# Patient Record
Sex: Female | Born: 1960 | Race: White | Hispanic: No
Health system: Midwestern US, Community
[De-identification: ages and names within clinical notes are randomized; demographics above are authoritative.]

---

## 2018-11-27 LAB — CBC WITH AUTO DIFFERENTIAL
Absolute Baso #: 0.1 10*3/uL (ref 0.0–0.2)
Absolute Eos #: 0.1 10*3/uL (ref 0.0–0.5)
Absolute Lymph #: 1.3 10*3/uL (ref 1.0–3.2)
Absolute Mono #: 0.6 10*3/uL (ref 0.3–1.0)
Basophils %: 1 % (ref 0.0–2.0)
Eosinophils %: 1 % (ref 0.0–7.0)
Hematocrit: 37.1 % (ref 34.0–47.0)
Hemoglobin: 13.6 g/dL (ref 11.5–15.7)
Immature Grans (Abs): 0.05 10*3/uL (ref 0.00–0.06)
Immature Granulocytes: 0.7 % — ABNORMAL HIGH (ref 0.1–0.6)
Lymphocytes: 18.4 % (ref 15.0–45.0)
MCH: 32.3 pg (ref 27.0–34.5)
MCHC: 36.7 g/dL — ABNORMAL HIGH (ref 32.0–36.0)
MCV: 88.1 fL (ref 81.0–99.0)
MPV: 10.2 fL (ref 7.2–13.2)
Monocytes: 9 % (ref 4.0–12.0)
Neutrophils %: 69.9 % (ref 42.0–74.0)
Neutrophils Absolute: 4.8 10*3/uL (ref 1.6–7.3)
Platelets: 212 10*3/uL (ref 140–440)
RBC: 4.21 x10e6/mcL (ref 3.60–5.20)
RDW: 11.8 % (ref 11.0–16.0)
WBC: 6.8 10*3/uL (ref 3.8–10.6)

## 2018-11-27 LAB — POC URINALYSIS, CHEMISTRY
Bilirubin, Urine, POC: NEGATIVE
Blood, UA POC: NEGATIVE
Glucose, UA POC: NEGATIVE mg/dL
Ketones, Urine, POC: NEGATIVE mg/dL
Nitrate, UA POC: NEGATIVE
Protein, Urine, POC: NEGATIVE
Specific Gravity, Urine, POC: <=1.005 (ref 1.003–1.035)
UROBILIN U POC: 0.2 EU/dL
pH, Urine, POC: 6 (ref 4.5–8.0)

## 2018-11-27 LAB — COMPREHENSIVE METABOLIC PANEL
ALT: 48 U/L — ABNORMAL HIGH (ref 0–33)
AST: 53 U/L — ABNORMAL HIGH (ref 0–32)
Albumin/Globulin Ratio: 1 mmol/L (ref 1.00–2.00)
Albumin: 3.9 g/dL (ref 3.5–5.2)
Alk Phosphatase: 72 U/L (ref 35–117)
Anion Gap: 12 mmol/L (ref 2–17)
BUN: 6 mg/dL (ref 6–20)
CO2: 32 mmol/L — ABNORMAL HIGH (ref 22–29)
Calcium: 9.7 mg/dL (ref 8.6–10.0)
Chloride: 73 mmol/L — CL (ref 98–107)
Creatinine: 0.9 mg/dL (ref 0.5–0.9)
GFR African American: 83 mL/min/{1.73_m2} — ABNORMAL LOW (ref >=90)
GFR Non-African American: 72 mL/min/{1.73_m2} — ABNORMAL LOW (ref >=90)
Globulin: 3 g/dL (ref 1.9–4.4)
Glucose: 144 mg/dL — ABNORMAL HIGH (ref 70–99)
OSMOLALITY CALCULATED: 237 mOsm/kg — ABNORMAL LOW (ref 270–287)
Potassium: 2.4 mmol/L — CL (ref 3.5–5.3)
Sodium: 117 mmol/L — CL (ref 135–145)
Total Bilirubin: 0.73 mg/dL (ref 0.00–1.20)
Total Protein: 6.6 g/dL (ref 6.4–8.3)

## 2018-11-27 LAB — SODIUM, URINE, RANDOM: SODIUM, RANDOM URINE: 20

## 2018-11-27 LAB — COVID-19, SURVEILLANCE (ASYMPTOMATIC/NO EXPOSURE, OR TEST OF CURE)
Lot/Kit Number: 706075
Lot/Kit expire date:: 6152021
SARS Cov2 Ag FIA: NEGATIVE

## 2018-11-27 LAB — AMMONIA: Ammonia: 42 mcmol/L (ref 11–51)

## 2018-11-27 LAB — SODIUM: Sodium: 116 mmol/L — CL (ref 135–145)

## 2018-11-27 NOTE — ED Notes (Signed)
ED Patient Education Note     Patient Education Materials Follows:

## 2018-11-27 NOTE — Discharge Summary (Signed)
ED Clinical Summary                     [facility]  [address] [place] [address]  [phone]          PERSON INFORMATION  Name: Allison Patton, SHROPSHIRE Age:  58 Years DOB: 1960-09-20   Sex: Female Language: English PCP: Sherril Cong   Marital Status: Single Phone: [phone] Med Service: MED-Medicine   MRN: [id number] Acct# [id number] Arrival: 11/27/2018 16:30:00   Visit Reason: Medical problem reevaluation; ABN LABS Acuity: 3 LOS: 000 03:06   Address:    [place] [address]   Diagnosis:    Dizziness; Hypokalemia; Hyponatremia  Medications:          Medications that have not changed  Other Medications  levothyroxine (levothyroxine 88 mcg (0.088 mg) oral capsule) 1 Capsules Oral (given by mouth) every day.  Last Dose:____________________  lisinopril (lisinopril 10 mg oral tablet) 1 Tabs Oral (given by mouth) every day.  Last Dose:____________________  melatonin Once a Day (at bedtime).  Last Dose:____________________      Medications Administered During Visit:                Medication Dose Route   Sodium Chloride 0.9% 1000 mL IV Piggyback   potassium chloride 40 mEq Oral               Allergies      No Known Medication Allergies      Major Tests and Procedures:  The following procedures and tests were performed during your ED visit.  COMMON PROCEDURES%>  COMMON PROCEDURES COMMENTS%>                PROVIDER INFORMATION               Provider Role Assigned [place], [place] ED MidLevel 11/27/2018 14:44:41    Waynard Reeds, RN, Bo Mcclintock ED Nurse 11/27/2018 15:09:36        Attending Physician:  Retta Diones      Admit Doc  BOND-MD,  BROOKE ERIKA     Consulting Doc  BOND-MD,  Mirian Capuchin     VITALS INFORMATION  Vital Sign Triage Latest   Temp Oral ORAL_1%> ORAL%>   Temp Temporal TEMPORAL_1%> TEMPORAL%>   Temp Intravascular INTRAVASCULAR_1%> INTRAVASCULAR%>   Temp Axillary AXILLARY_1%> AXILLARY%>   Temp Rectal RECTAL_1%>  RECTAL%>   02 Sat 94 % 99 %   Respiratory Rate RATE_1%> RATE%>   Peripheral Pulse Rate PULSE RATE_1%> PULSE RATE%>   Apical Heart Rate HEART RATE_1%> HEART RATE%>   Blood Pressure BLOOD PRESSURE_1%>/ BLOOD PRESSURE_1%>74 mmHg BLOOD PRESSURE%> / BLOOD PRESSURE%>67 mmHg                 Immunizations      No Immunizations Documented This Visit          DISCHARGE INFORMATION   Discharge Disposition: S Outpt-Admitted As Inpatient   Discharge Location:  [facility]   Discharge Date and Time:     ED Checkout Date and Time:  11/27/2018 17:49:30     DEPART REASON INCOMPLETE INFORMATION             Problems      No Problems Documented              Smoking Status      Former smoker, quit more than  30 days ago         PATIENT EDUCATION INFORMATION  Instructions:          Follow up:            ED PROVIDER DOCUMENTATION

## 2018-11-27 NOTE — ED Notes (Signed)
ED Triage Note       ED Triage Adult Entered On:  11/27/2018 14:56 EDT    Performed On:  11/27/2018 14:48 EDT by Waynard Reeds, RN, Gustine S               Triage   Chief Complaint :   Seen at [facility] yesterday for dizziness, they sent her here today for recheck and treatment for low potassium and sodium   Numeric Rating Pain Scale :   0 = No pain   [place] Mode of Arrival :   Walking   Infectious Disease Documentation :   Document assessment   Temperature Oral :   36.8 degC(Converted to: 98.2 degF)    Heart Rate Monitored :   80 bpm   Respiratory Rate :   16 br/min   Systolic Blood Pressure :   283 mmHg   Diastolic Blood Pressure :   74 mmHg   SpO2 :   94 %   Oxygen Therapy :   Room air   Patient presentation :   None of the above   Chief Complaint or Presentation suggest infection :   No   Weight Dosing :   84 kg(Converted to: 185 lb 3 oz)    Height :   173 cm(Converted to: 5 ft 8 in)    Body Mass Index Dosing :   28 kg/m2   Reva Bores - 11/27/2018 14:48 EDT   DCP GENERIC CODE   Tracking Acuity :   3   Tracking Group :   ED [employer]   [place], RNBo Mcclintock - 11/27/2018 14:48 EDT   ED General Section :   Document assessment   Pregnancy Status :   Patient denies   ED Allergies Section :   Document assessment   ED Reason for Visit Section :   Document assessment   ED Home Meds Section :   Document assessment   Waynard Reeds, RN, Bo Mcclintock - 11/27/2018 14:48 EDT   ID Risk Screen Symptoms   Recent Travel History :   No recent travel   Close Contact with COVID-19 ID :   No   Last 14 days COVID-19 ID :   Yes - Not Detected (negative)   TB Symptom Screen :   No symptoms   C. diff Symptom/History ID :   Neither of the above   GARBETT, RN, Jocelyn Lamer S - 11/27/2018 14:48 EDT   Allergies   (As Of: 11/27/2018 14:56:18 EDT)   Allergies (Active)   No Known Medication Allergies  Estimated Onset Date:   Unspecified ; Created By:   Marvel Plan RN, Anderson Malta; Reaction Status:   Active ; Category:   Drug ; Substance:   No Known  Medication Allergies ; Type:   Allergy ; Updated By:   Marvel Plan RN, JENNIFER; Reviewed Date:   11/27/2018 14:52 EDT        Psycho-Social   Last 3 mo, thoughts killing self/others :   Patient denies   Waynard Reeds, RNBo Mcclintock - 11/27/2018 14:48 EDT   ED Home Med List   Medication List   (As Of: 11/27/2018 14:56:18 EDT)   Home Meds    atenolol-chlorthalidone  :   atenolol-chlorthalidone ; Status:   Discontinued ; Ordered As Mnemonic:   atenolol-chlorthalidone 50 mg-25 mg oral tablet ; Simple Display Line:   1 tabs, Oral, Daily, 30 tabs, 0 Refill(s) ; Catalog Code:   atenolol-chlorthalidone ; Order Dt/Tm:  06/10/2017 15:06:04 EST          ergocalciferol  :   ergocalciferol ; Status:   Discontinued ; Ordered As Mnemonic:   Vitamin D ; Simple Display Line:   Oral, 0 Refill(s) ; Catalog Code:   ergocalciferol ; Order Dt/Tm:   06/10/2017 15:06:38 EST          lisinopril  :   lisinopril ; Status:   Documented ; Ordered As Mnemonic:   lisinopril 10 mg oral tablet ; Simple Display Line:   10 mg, 1 tabs, Oral, Daily, 0 Refill(s) ; Catalog Code:   lisinopril ; Order Dt/Tm:   06/10/2017 15:06:15 EST          melatonin  :   melatonin ; Status:   Documented ; Ordered As Mnemonic:   melatonin ; Simple Display Line:   Once a Day (at bedtime), 0 Refill(s) ; Catalog Code:   melatonin ; Order Dt/Tm:   06/10/2017 15:06:31 EST          levothyroxine  :   levothyroxine ; Status:   Documented ; Ordered As Mnemonic:   levothyroxine 88 mcg (0.088 mg) oral capsule ; Simple Display Line:   88 mcg, 1 caps, Oral, Daily, 30 caps, 0 Refill(s) ; Catalog Code:   levothyroxine ; Order Dt/Tm:   06/10/2017 15:05:53 EST            ED Reason for Visit   (As Of: 11/27/2018 14:56:18 EDT)   Problems(Active)    HTN (hypertension) (SNOMED CT  :[id number] )  Name of Problem:   HTN (hypertension) ; Recorder:   RICHARDSON, RN, Cardinal HealthJENNIFER; Confirmation:   Confirmed ; Classification:   Patient Stated ; Code:   [id number] ; Contributor System:   DietitianowerChart ; Last Updated:    06/10/2017 15:04 EST ; Life Cycle Date:   06/10/2017 ; Life Cycle Status:   Active ; Vocabulary:   SNOMED CT        Thyroid disease (SNOMED CT  :2956213024324018 )  Name of Problem:   Thyroid disease ; Recorder:   RICHARDSON, RN, JENNIFER; Confirmation:   Confirmed ; Classification:   Patient Stated ; Code:   [id number] ; Contributor System:   DietitianowerChart ; Last Updated:   06/10/2017 15:04 EST ; Life Cycle Date:   06/10/2017 ; Life Cycle Status:   Active ; Vocabulary:   SNOMED CT          Diagnoses(Active)    Medical problem reevaluation  Date:   11/27/2018 ; Diagnosis Type:   Reason For Visit ; Confirmation:   Complaint of ; Clinical Dx:   Medical problem reevaluation ; Classification:   Medical ; Clinical Service:   Emergency medicine ; Code:   PNED ; Probability:   0 ; Diagnosis Code:   [id number]

## 2018-11-27 NOTE — Nursing Note (Signed)
Adult Patient History Form-Text       Adult Patient History Entered On:  11/27/2018 18:57 EDT    Performed On:  11/27/2018 18:30 EDT by Mosie Epstein, RN, MEGAN M               General Info   Patient Identified :   Identification band   Patient Identified :   Allison Patton   Information Given By :   Self   Accompanied By :   None   In Charge of News (ICON) Name :   [place], local friend, [phone]  Yajaira Fayard, brother, [phone]  Jassmin Macon, sister in law, [phone]   Pregnancy Status :   Patient denies   Has the patient received chemotherapy or immunotherapy (cytotoxic)  in the last 48-72 hours? :   No   In Clinical Trial With Signed Consent for Related Condition :   No signed consent for clinical trial   Is the patient currently (2-3 days) receiving radiation treatment? :   No   HIGBIE, RN, MEGAN M - 11/27/2018 18:30 EDT   Allergies   (As Of: 11/27/2018 19:43:47 EDT)   Allergies (Active)   No Known Medication Allergies  Estimated Onset Date:   Unspecified ; Created By:   Senaida Ores RN, Victorino Dike; Reaction Status:   Active ; Category:   Drug ; Substance:   No Known Medication Allergies ; Type:   Allergy ; Updated By:   Senaida Ores RN, JENNIFER; Reviewed Date:   11/27/2018 19:37 EDT        Medication History   Medication List   (As Of: 11/27/2018 19:43:47 EDT)   Normal Order    folic acid 1 mg Tab  :   folic acid 1 mg Tab ; Status:   Ordered ; Ordered As Mnemonic:   folic acid ; Simple Display Line:   1 mg, 1 tabs, Oral, Daily ; Ordering Provider:   Ala Bent; Catalog Code:   folic acid ; Order Dt/Tm:   11/27/2018 18:08:35 EDT          lisinopril 10 mg Tab  :   lisinopril 10 mg Tab ; Status:   Ordered ; Ordered As Mnemonic:   lisinopril ; Simple Display Line:   10 mg, 1 tabs, Oral, Daily ; Ordering Provider:   Ala Bent; Catalog Code:   lisinopril ; Order Dt/Tm:   11/27/2018 17:06:08 EDT          Therapeutic Multiple Vitamins with Minerals Tab  :   Therapeutic Multiple Vitamins with Minerals Tab  ; Status:   Ordered ; Ordered As Mnemonic:   Therapeutic Multiple Vitamins with Minerals oral tablet ; Simple Display Line:   1 tabs, Oral, Daily ; Ordering Provider:   Ala Bent; Catalog Code:   multivitamin with minerals ; Order Dt/Tm:   11/27/2018 18:08:35 EDT          thiamine 100 mg Tab  :   thiamine 100 mg Tab ; Status:   Ordered ; Ordered As Mnemonic:   thiamine ; Simple Display Line:   100 mg, 1 tabs, Oral, Daily ; Ordering Provider:   Ala Bent; Catalog Code:   thiamine ; Order Dt/Tm:   11/27/2018 18:08:35 EDT          levothyroxine 88 mcg (0.088 mg) Tab  :   levothyroxine 88 mcg (0.088 mg) Tab ; Status:   Ordered ; Ordered As Mnemonic:   levothyroxine ; Simple Display Line:  88 mcg, 1 tabs, Oral, Daily ; Ordering Provider:   Ala Bent; Catalog Code:   levothyroxine ; Order Dt/Tm:   11/27/2018 17:06:06 EDT          melatonin 3 mg Tab  :   melatonin 3 mg Tab ; Status:   Ordered ; Ordered As Mnemonic:   melatonin ; Simple Display Line:   3 mg, 1 tabs, Oral, Once a Day (at bedtime) ; Ordering Provider:   Ala Bent; Catalog Code:   melatonin ; Order Dt/Tm:   11/27/2018 17:06:09 EDT          potassium chloride 20 mEq ER Tab  :   potassium chloride 20 mEq ER Tab ; Status:   Completed ; Ordered As Mnemonic:   K-Dur 20 ; Simple Display Line:   40 mEq, 2 tabs, Oral, Once ; Ordering Provider:   Ala Bent; Catalog Code:   potassium chloride ; Order Dt/Tm:   11/27/2018 19:01:47 EDT          potassium chloride  :   potassium chloride ; Status:   Ordered ; Ordered As Mnemonic:   potassium chloride 10 mEq/100 mL intravenous solution ; Simple Display Line:   10 mEq, 100 mL, 100 mL/hr, IV Piggyback, q1hr ; Ordering Provider:   Ala Bent; Catalog Code:   potassium chloride ; Order Dt/Tm:   11/27/2018 19:02:53 EDT ; Comment:   Note total dose of Potassium is . All orders for IV Potassium require a dose limit for patient safety.           acetaminophen 325 mg Tab  :   acetaminophen 325 mg Tab ; Status:   Ordered ; Ordered As Mnemonic:   acetaminophen ; Simple Display Line:   650 mg, 2 tabs, Oral, q4hr, PRN: mild pain (1-3) ; Ordering Provider:   Ala Bent; Catalog Code:   acetaminophen ; Order Dt/Tm:   11/27/2018 18:09:18 EDT          aluminum hydroxide/magnesium hydroxide/simethicone 200 mg-200 mg-20 mg/5 mL  :   aluminum hydroxide/magnesium hydroxide/simethicone 200 mg-200 mg-20 mg/5 mL ; Status:   Ordered ; Ordered As Mnemonic:   aluminum hydroxide/magnesium hydroxide/simethicone 200 mg-200 mg-20 mg/5 mL oral suspension ; Simple Display Line:   30 mL, Oral, q3hr, PRN: indigestion ; Ordering Provider:   Ala Bent; Catalog Code:   Al hydroxide/Mg hydroxide/simethicone ; Order Dt/Tm:   11/27/2018 18:09:18 EDT          ondansetron 2 mg/mL Inj Soln 2 mL  :   ondansetron 2 mg/mL Inj Soln 2 mL ; Status:   Ordered ; Ordered As Mnemonic:   ondansetron ; Simple Display Line:   4 mg, 2 mL, IV Push, q6hr, PRN: nausea/vomiting ; Ordering Provider:   Ala Bent; Catalog Code:   ondansetron ; Order Dt/Tm:   11/27/2018 18:09:18 EDT          LORazepam 0.5 mg Tab  :   LORazepam 0.5 mg Tab ; Status:   Ordered ; Ordered As Mnemonic:   LORazepam ; Simple Display Line:   0.5 mg, 1 tabs, Oral, q4hr, PRN: other (see comment) ; Ordering Provider:   Ala Bent; Catalog Code:   LORazepam ; Order Dt/Tm:   11/27/2018 18:08:35 EDT ; Comment:   PRN for CIWA Score greater than or equal to 8. Repeat CIWA in 2hrs, notify MD if greater than or equal to 8.  Consider lower dose for medically compromised or elderly greater than or equal to 65          potassium chloride 20 mEq ER Tab  :   potassium chloride 20 mEq ER Tab ; Status:   Completed ; Ordered As Mnemonic:   potassium chloride 20 mEq oral tablet, extended release ; Simple Display Line:   40 mEq, 2 tabs, Oral, Once ; Ordering Provider:   Karlene Einstein LANE-PA-C; Catalog Code:    potassium chloride ; Order Dt/Tm:   11/27/2018 16:14:58 EDT          Sodium Chloride 0.9% intravenous solution Bolus  :   Sodium Chloride 0.9% intravenous solution Bolus ; Status:   Completed ; Ordered As Mnemonic:   Sodium Chloride 0.9% bolus ; Simple Display Line:   1,000 mL, 2000 mL/hr, IV Piggyback, Once ; Ordering Provider:   Karlene Einstein LANE-PA-C; Catalog Code:   Sodium Chloride 0.9% ; Order Dt/Tm:   11/27/2018 15:00:22 EDT            Home Meds    ALPRAZolam  :   ALPRAZolam ; Status:   Documented ; Ordered As Mnemonic:   ALPRAZolam 1 mg oral tablet ; Simple Display Line:   1 mg, 1 tabs, Oral, Daily, PRN: for anxiety, 0 Refill(s) ; Catalog Code:   ALPRAZolam ; Order Dt/Tm:   11/27/2018 18:07:01 EDT          atenolol-chlorthalidone  :   atenolol-chlorthalidone ; Status:   Discontinued ; Ordered As Mnemonic:   atenolol-chlorthalidone 50 mg-25 mg oral tablet ; Simple Display Line:   1 tabs, Oral, Daily, 30 tabs, 0 Refill(s) ; Catalog Code:   atenolol-chlorthalidone ; Order Dt/Tm:   06/10/2017 15:06:04 EST          ergocalciferol  :   ergocalciferol ; Status:   Discontinued ; Ordered As Mnemonic:   Vitamin D ; Simple Display Line:   Oral, 0 Refill(s) ; Catalog Code:   ergocalciferol ; Order Dt/Tm:   06/10/2017 15:06:38 EST          lisinopril  :   lisinopril ; Status:   Documented ; Ordered As Mnemonic:   lisinopril 10 mg oral tablet ; Simple Display Line:   10 mg, 1 tabs, Oral, Daily, 0 Refill(s) ; Catalog Code:   lisinopril ; Order Dt/Tm:   06/10/2017 15:06:15 EST          melatonin  :   melatonin ; Status:   Documented ; Ordered As Mnemonic:   melatonin ; Simple Display Line:   Once a Day (at bedtime), 0 Refill(s) ; Catalog Code:   melatonin ; Order Dt/Tm:   06/10/2017 15:06:31 EST          levothyroxine  :   levothyroxine ; Status:   Documented ; Ordered As Mnemonic:   levothyroxine 88 mcg (0.088 mg) oral capsule ; Simple Display Line:   88 mcg, 1 caps, Oral, Daily, 30 caps, 0 Refill(s) ; Catalog Code:    levothyroxine ; Order Dt/Tm:   06/10/2017 15:05:53 EST            Problem History   (As Of: 11/27/2018 19:43:47 EDT)   Problems(Active)    HTN (hypertension) (SNOMED CT  :3220254270 )  Name of Problem:   HTN (hypertension) ; Recorder:   RICHARDSON, RN, [employer]; Confirmation:   Confirmed ; Classification:   Patient Stated ; Code:   [id number] ; Contributor System:   Dietitian ; Last Updated:  06/10/2017 15:04 EST ; Life Cycle Date:   06/10/2017 ; Life Cycle Status:   Active ; Vocabulary:   SNOMED CT        Thyroid disease (SNOMED CT  :16109604 )  Name of Problem:   Thyroid disease ; Recorder:   RICHARDSON, RN, JENNIFER; Confirmation:   Confirmed ; Classification:   Patient Stated ; Code:   [id number] ; Contributor System:   Dietitian ; Last Updated:   06/10/2017 15:04 EST ; Life Cycle Date:   06/10/2017 ; Life Cycle Status:   Active ; Vocabulary:   SNOMED CT          Diagnoses(Active)    Dizziness  Date:   11/27/2018 ; Diagnosis Type:   Discharge ; Confirmation:   Confirmed ; Clinical Dx:   Dizziness ; Classification:   Medical ; Clinical Service:   Non-Specified ; Code:   ICD-10-CM ; Probability:   0 ; Diagnosis Code:   R42      Hypokalemia  Date:   11/27/2018 ; Diagnosis Type:   Discharge ; Confirmation:   Confirmed ; Clinical Dx:   Hypokalemia ; Classification:   Medical ; Clinical Service:   Non-Specified ; Code:   ICD-10-CM ; Probability:   0 ; Diagnosis Code:   E87.6      Hyponatremia  Date:   11/27/2018 ; Diagnosis Type:   Discharge ; Confirmation:   Confirmed ; Clinical Dx:   Hyponatremia ; Classification:   Medical ; Clinical Service:   Non-Specified ; Code:   ICD-10-CM ; Probability:   0 ; Diagnosis Code:   E87.1      Medical problem reevaluation  Date:   11/27/2018 ; Diagnosis Type:   Reason For Visit ; Confirmation:   Complaint of ; Clinical Dx:   Medical problem reevaluation ; Classification:   Medical ; Clinical Service:   Emergency medicine ; Code:   PNED ; Probability:   0 ; Diagnosis Code:    [id number]        Procedure History        -    Procedure History   (As Of: 11/27/2018 19:43:47 EDT)     Anesthesia Minutes:   0 ; Procedure Name:   Hysterectomy ; Procedure Minutes:   0 ; Last Reviewed Dt/Tm:   11/27/2018 19:37:40 EDT            Anesthesia Minutes:   0 ; Procedure Name:   Caesarean delivery - delivered ; Procedure Minutes:   0 ; Last Reviewed Dt/Tm:   11/27/2018 19:37:40 EDT            Immunizations   Last Tetanus :   Unknown   HIGBIE, RN, MEGAN M - 11/27/2018 19:37 EDT   ID Risk Screen Symptoms   Recent Travel History :   No recent travel   Close Contact with COVID-19 ID :   No   Last 14 days COVID-19 ID :   Yes - Not Detected (negative)   TB Symptom Screen :   No symptoms   C. diff Symptom/History ID :   Neither of the above   Patient Pregnant :   None of the above   CRE Screening :   Not applicable   HIGBIE, RN, MEGAN M - 11/27/2018 19:37 EDT   Bloodless Medicine   Is Blood Transfusion Acceptable to Patient :   Yes   HIGBIE, RN, MEGAN M - 11/27/2018 19:37 EDT   Nutrition   MST Does Your Current Diet Include :  None   MST Have You Recently Lost Weight Without Trying? :   Yes - see next question   MST If Yes, How Much Weight Have You Lost? :   14-23 lb   MST Weight Loss Score :   2    MST Have You Been Eating Poorly? :   Yes   MST Appetite Score :   1    MST Score :   3    MST Interpretation :   At risk   HIGBIE, RN, MEGAN M - 11/27/2018 19:37 EDT   Functional   Sensory Deficits :   None   HIGBIE, RN, MEGAN M - 11/27/2018 19:37 EDT   Social History   Social History   (As Of: 11/27/2018 19:43:47 EDT)   Tobacco:        Tobacco use: Former smoker, quit more than 30 days ago.   (Last Updated: 06/10/2017 15:06:54 EST by Senaida Ores, RN, JENNIFER)          Alcohol:        Current, Daily   (Last Updated: 06/10/2017 15:07:01 EST by Senaida Ores, RN, JENNIFER)            Spiritual   Faith/Denomination :   None specified   HIGBIE, RN, MEGAN M - 11/27/2018 19:37 EDT   Harm Screen    Injuries/Abuse/Neglect in Household :   Denies   Feels Unsafe at Home :   Yes   Last 3 mo, thoughts killing self/others :   Patient denies   HIGBIE, RN, Tylene Fantasia - 11/27/2018 19:37 EDT   Advance Directive   Advance Directive :   No   Patient Wishes to Receive Further Information on Advance Directives :   No   HIGBIE, RN, Tylene Fantasia - 11/27/2018 19:37 EDT   Education   Primary Language :   Edman Circle, RN, MEGAN M - 11/27/2018 19:37 EDT   Caregiver/Advocate Language   Patient :   Printed materials, Verbal explanation   HIGBIE, RN, MEGAN M - 11/27/2018 19:37 EDT   Barriers to Learning :   None evident   Teaching Method :   Explanation   HIGBIE, RN, MEGAN M - 11/27/2018 19:37 EDT   Preventative Measures Information   Unit/Room Orientation :   Verbalizes understanding   Environmental Safety :   Verbalizes understanding   Hand Washing :   Verbalizes understanding   Infection Prevention :   Verbalizes understanding   DVT Prophylaxis :   Verbalizes understanding   Isolation Precaution :   Verbalizes understanding   HIGBIE, RN, MEGAN M - 11/27/2018 19:37 EDT   DC Needs   CM Living Situation :   Home with no services   Anticipated Discharge Needs :   None   HIGBIE, RN, MEGAN M - 11/27/2018 19:37 EDT   Valuables and Belongings   Does Patient Have Valuables and Belongings :   Yes   HIGBIE, RN, MEGAN M - 11/27/2018 19:37 EDT   Valuables and Belongings   At Bedside :   Clothes, Purse/Wallet, Money, Cell phone, Medications   HIGBIE, RN, MEGAN M - 11/27/2018 19:37 EDT   Patient Search Completed :   NA   HIGBIE, RN, MEGAN M - 11/27/2018 19:37 EDT   Admission Complete   Admission Complete :   No   HIGBIE, RN, MEGAN M - 11/27/2018 18:30 EDT

## 2018-11-27 NOTE — ED Provider Notes (Signed)
Dizziness *ED        Patient:   Allison Patton, Allison Patton             MRN: [id number]            FIN: [id number]               Age:   58 years     Sex:  Female     DOB:  06/23/1960   Associated Diagnoses:   Dizziness; Hyponatremia; Hypokalemia   Author:   Jacky Kindle      Basic Information   Time seen: Provider Seen (ST)   ED Provider/Time:    Hanley Hays LANE / 11/27/2018 14:44  .   Additional information: Chief Complaint from Nursing Triage Note   Chief Complaint  Chief Complaint: Seen at [facility] yesterday for dizziness, they sent her here today for recheck and treatment for low potassium and sodium (11/27/18 14:48:00).      History of Present Illness   Patient is a 58 year old female who presents to the emergency department for evaluation of dizziness.  She states that she is felt fairly consistently off balance and lightheaded over the course of the past couple of months, but that it is been more severe in the past couple of weeks, causing her to have multiple falls.  She denies any history of similar previous to this.  She admits to some decreased appetite and weight loss.  She denies any associated fevers, chills, head injury, difficulty with speech, focal weakness, numbness, or tingling in her extremities, chest pain, palpitations, shortness of breath, abdominal pain, nausea/vomiting, diarrhea, bloody stools or melena, urinary complaints.  She went to urgent care yesterday and had lab evaluation which showed hyponatremia and hypokalemia.  They encouraged her to come to the emergency department for further evaluation.  Of note, patient states she drinks approximately 3 vodka waters daily.  She denies any drug use.  She has not tried anything for symptom relief yet.  Also of note, patient was on atenolol-chlorthalidone for her hypertension, which was discontinued at her urgent care visit yesterday.        Review of Systems   Constitutional symptoms:  Fatigue, no fever, no chills.    Eye symptoms:  No  blurred vision,    Respiratory symptoms:  No shortness of breath, no cough.    Cardiovascular symptoms:  No chest pain, no palpitations, no syncope, no peripheral edema.    Gastrointestinal symptoms:  No abdominal pain, no nausea, no vomiting, no diarrhea, no rectal bleeding.    Neurologic symptoms:  Dizziness, no headache, no altered level of consciousness, no numbness, no tingling, no focal weakness.       Health Status   Allergies:    Allergic Reactions (All)  No Known Medication Allergies.   Medications:  (Selected)   Inpatient Medications  Ordered  Sodium Chloride 0.9% bolus: 1,000 mL, 2000 mL/hr, IV Piggyback, Once  Documented Medications  Documented  levothyroxine 88 mcg (0.088 mg) oral capsule: 88 mcg, 1 caps, Oral, Daily, 30 caps, 0 Refill(s)  lisinopril 10 mg oral tablet: 10 mg, 1 tabs, Oral, Daily, 0 Refill(s)  melatonin: Once a Day (at bedtime), 0 Refill(s).      Past Medical/ Family/ Social History   Medical history: Reviewed as documented in chart.   Surgical history: Reviewed as documented in chart.   Family history: Not significant.   Social history: Reviewed as documented in chart.   Problem list:  Active Problems (2)  HTN (hypertension)   Thyroid disease   , per nurse's notes.      Physical Examination               Vital Signs   Vital Signs   09/19/5407 81:19 EDT Systolic Blood Pressure 147 mmHg    Diastolic Blood Pressure 74 mmHg    Temperature Oral 36.8 degC    Heart Rate Monitored 80 bpm    Respiratory Rate 16 br/min    SpO2 94 %   .   Measurements   11/27/2018 14:56 EDT Body Mass Index est meas 28.07 kg/m2    Body Mass Index Measured 28.07 kg/m2   11/27/2018 14:48 EDT Height/Length Measured 173 cm    Weight Dosing 84 kg   .   General:  Alert, no acute distress.    Skin:  Warm, dry.    Head:  Normocephalic, atraumatic.    Neck:  Supple, trachea midline, no tenderness.    Eye:  Pupils are equal, round and reactive to light, extraocular movements are intact, normal conjunctiva.    Cardiovascular:   Regular rate and rhythm, Normal peripheral perfusion.    Respiratory:  Lungs are clear to auscultation, respirations are non-labored, breath sounds are equal, Symmetrical chest wall expansion.    Gastrointestinal:  Soft, Nontender.    Neurological:  Alert and oriented to person, place, time, and situation, No focal neurological deficit observed, normal sensory observed, normal motor observed, normal speech observed, normal coordination observed, Normal finger-to-nose and heel-to-shin.  Steady gait.  No ataxia.Marland Kitchen    Psychiatric:  Cooperative, appropriate mood & affect, normal judgment.       Medical Decision Making   Differential Diagnosis:  Dizziness, vertigo, generalized weakness, cerebral vascular accident, hyperglycemia, hypoglycemia, dehydration, anemia.    Rationale:  PA/NP reviewed with co-signing physician: ECG, lab results, radiology studies, medication, diagnosis, and plan of care.   Documents reviewed:  Emergency department nurses' notes.   Results review:  Lab results : Lab View   11/27/2018 18:55 EDT Sodium Lvl 116 mmol/L  CRIT   11/27/2018 18:21 EDT Na Rand U 20  NA   11/27/2018 18:05 EDT Estimated Creatinine Clearance 80.38 mL/min   11/27/2018 16:59 EDT SARS Cov2 Ag FIA Negative   11/27/2018 16:30 EDT Appear U POC Clear    Color U POC Yellow    Bili U POC Negative    Blood U POC Negative    Glucose U POC Negative mg/dL    Ketones U POC Negative mg/dL    Leuk Est U POC Large    Nitrite U POC Negative    pH U POC 6.0    Protein U POC Negative    Spec Grav U POC <=1.005    Urobilin U POC 0.2 EU/dL   11/27/2018 16:14 EDT Estimated Creatinine Clearance 80.38 mL/min   11/27/2018 15:18 EDT WBC 6.8 x10e3/mcL    RBC 4.21 x10e6/mcL    Hgb 13.6 g/dL    HCT 37.1 %    MCV 88.1 fL    MCH 32.3 pg    MCHC 36.7 g/dL  HI    RDW 11.8 %    Platelet 212 x10e3/mcL    MPV 10.2 fL    Neutro Auto 69.9 %    Neutro Absolute 4.8 x10e3/mcL    Immature Grans Percent 0.7 %  HI    Immature Grans Absolute 0.05 x10e3/mcL    Lymph Auto 18.4 %     Lymph Absolute 1.3 x10e3/mcL  Mono Auto 9.0 %    Mono Absolute 0.6 x10e3/mcL    Eosinophil Percent 1.0 %    Eos Absolute 0.1 x10e3/mcL    Basophil Auto 1.0 %    Baso Absolute 0.1 x10e3/mcL    Sodium Lvl 117 mmol/L  CRIT    Potassium Lvl 2.4 mmol/L  CRIT    Chloride 73 mmol/L  CRIT    CO2 32 mmol/L  HI    Glucose Random 144 mg/dL  HI    BUN 6 mg/dL    Creatinine Lvl 0.9 mg/dL    AGAP 12 mmol/L    Osmolality Calc 237 mOsm/kg  LOW    Calcium Lvl 9.7 mg/dL    Protein Total 6.6 g/dL    Albumin Lvl 3.9 g/dL    Globulin Calc 3.0 g/dL    AG Ratio Calc 1.00 mmol/L    Alk Phos 72 unit/L    AST 53 unit/L  HI    ALT 48 unit/L  HI    eGFR AA 83 mL/min/1.52m???  LOW    eGFR Non-AA 72 mL/min/1.766m??  LOW    Bili Total 0.73 mg/dL    Ammonia Lvl 42 mcmol/L   11/27/2018 14:56 EDT Estimated Creatinine Clearance 144.68 mL/min   .   Radiology results:  Rad Results (ST)   CT Head or Brain w/o Contrast  ?  11/27/18 15:58:48  CT HEAD WITHOUT CONTRAST    DATE: 11/27/18    INDICATION: Confusion, acute, unexplained.    TECHNIQUE: Noncontrast axial images obtained from the skull base through the  vertex. CT scanning was performed using radiation dose reduction techniques,  where appropriate, per system protocols.    COMPARISON: None.    FINDINGS: There is no acute intracranial hemorrhage, mass effect, abnormal  extra-axial fluid collection, or hydrocephalus. Parenchymal density is within  normal limits.    There is no evidence of skull fracture or aggressive osseous lesion.    Included paranasal sinuses are predominantly clear. The included mastoid air  cells are clear.    IMPRESSION:    There is no evidence of acute intracranial abnormality.  ?  Signed By: ANNovella Olive.   Notes:  [age]ear old female who is been having dizziness for the past couple of months, exacerbated over the past several weeks and days with multiple falls.  She is resting comfortably currently, no acute distress.  Hemodynamically stable and nontoxic in  appearance.  She has a completely normal neurologic exam without any focal deficits or ataxia.  She has a steady gait when ambulating in the hall.  Heart sounds are regular she has good peripheral perfusion.  Lungs are clear to auscultation bilaterally with nonlabored respirations.  Abdomen is soft and nontender.  EKG shows normal sinus rhythm without any evidence of acute ischemia or arrhythmia today.  Labs including CBC, CMP, urinalysis are reviewed.  She has market electrolyte abnormalities including a sodium level of 117, which I suspect is likely the etiology for her symptoms.  She is given IV fluids here.  She recently discontinued as of yesterday her diuretic, but no other clear etiology for hyponatremia.  CT scan of her brain was also obtained to rule out intracranial process and this was negative.  I discussed with the on-call hospitalist for consideration of admission.  Results and plan of care discussed with patient at bedside who voices understanding.  She will be transferred upstairs in stable condition.      Impression and Plan   Diagnosis   Dizziness (  ICD10-CM R42, Discharge, Medical)   Hyponatremia (ICD10-CM E87.1, Discharge, Medical)   Hypokalemia (ICD10-CM E87.6, Discharge, Medical)   Plan   Condition: Stable.    Disposition: Admit to Intensive Care Unit.    Counseled: Patient, Regarding diagnosis, Regarding diagnostic results, Regarding treatment plan, Patient indicated understanding of instructions.    Signature Line     Electronically Signed on 11/27/2018 08:13 PM EDT   ________________________________________________   Roxanna Mew LANE-PA-C, PA-C               Modified by: Roxanna Mew LANE-PA-C, PA-C on 11/27/2018 04:04 PM EDT      Modified by: Roxanna Mew LANE-PA-C, PA-C on 11/27/2018 04:14 PM EDT      Modified by: Roxanna Mew LANE-PA-C, PA-C on 11/27/2018 04:21 PM EDT      Modified by: Roxanna Mew LANE-PA-C, PA-C on 11/27/2018 04:30 PM EDT      Modified by: Roxanna Mew LANE-PA-C,  PA-C on 11/27/2018 08:13 PM EDT      Modified by: Roxanna Mew LANE-PA-C, PA-C on 11/27/2018 08:13 PM EDTAddendum by Holli Humbles, MD, Erin Sons on December 03, 2018 22:02 EDT               The patient???s history, exam findings, diagnostics, and a summary of any interventions or procedures was reviewed in detail with PA.  Signature Line     Electronically Signed on 12/03/2018 10:02 PM EDT   ________________________________________________   Holli Humbles, MD, Erin Sons               Modified by: Holli Humbles, MD, Erin Sons on 12/03/2018 10:02 PM EDT

## 2018-11-27 NOTE — ED Notes (Signed)
ED Triage Note       ED Secondary Triage Entered On:  11/27/2018 17:48 EDT    Performed On:  11/27/2018 17:47 EDT by Laurie Panda, RN, Melanee Spry               General Information   Barriers to Learning :   None evident   ED Home Meds Section :   Document assessment   [facility] ED Fall Risk Section :   Document assessment   ED Advance Directives Section :   Document assessment   ED Palliative Screen :   N/A (prefilled for <56yo)   Laurie Panda RN, Chip Boer S - 11/27/2018 17:47 EDT   (As Of: 11/27/2018 17:48:04 EDT)   Problems(Active)    HTN (hypertension) (SNOMED CT  :2841324401 )  Name of Problem:   HTN (hypertension) ; Recorder:   RICHARDSON, RN, [employer]; Confirmation:   Confirmed ; Classification:   Patient Stated ; Code:   [id number] ; Contributor System:   Dietitian ; Last Updated:   06/10/2017 15:04 EST ; Life Cycle Date:   06/10/2017 ; Life Cycle Status:   Active ; Vocabulary:   SNOMED CT        Thyroid disease (SNOMED CT  :03474259 )  Name of Problem:   Thyroid disease ; Recorder:   RICHARDSON, RN, JENNIFER; Confirmation:   Confirmed ; Classification:   Patient Stated ; Code:   [id number] ; Contributor System:   Dietitian ; Last Updated:   06/10/2017 15:04 EST ; Life Cycle Date:   06/10/2017 ; Life Cycle Status:   Active ; Vocabulary:   SNOMED CT          Diagnoses(Active)    Dizziness  Date:   11/27/2018 ; Diagnosis Type:   Discharge ; Confirmation:   Confirmed ; Clinical Dx:   Dizziness ; Classification:   Medical ; Clinical Service:   Non-Specified ; Code:   ICD-10-CM ; Probability:   0 ; Diagnosis Code:   R42      Hypokalemia  Date:   11/27/2018 ; Diagnosis Type:   Discharge ; Confirmation:   Confirmed ; Clinical Dx:   Hypokalemia ; Classification:   Medical ; Clinical Service:   Non-Specified ; Code:   ICD-10-CM ; Probability:   0 ; Diagnosis Code:   E87.6      Hyponatremia  Date:   11/27/2018 ; Diagnosis Type:   Discharge ; Confirmation:   Confirmed ; Clinical Dx:   Hyponatremia ; Classification:   Medical ; Clinical Service:    Non-Specified ; Code:   ICD-10-CM ; Probability:   0 ; Diagnosis Code:   E87.1      Medical problem reevaluation  Date:   11/27/2018 ; Diagnosis Type:   Reason For Visit ; Confirmation:   Complaint of ; Clinical Dx:   Medical problem reevaluation ; Classification:   Medical ; Clinical Service:   Emergency medicine ; Code:   PNED ; Probability:   0 ; Diagnosis Code:   [id number]             -    Procedure History   (As Of: 11/27/2018 17:48:04 EDT)     Anesthesia Minutes:   0 ; Procedure Name:   Hysterectomy ; Procedure Minutes:   0            Anesthesia Minutes:   0 ; Procedure Name:   Caesarean delivery - delivered ; Procedure Minutes:   0  [facility] Fall Risk Assessment Tool   Hx of falling last 3 months ED Fall :   Yes (Single mechanical fall)   Patient confused or disoriented ED Fall :   No   Patient intoxicated or sedated ED Fall :   No   Patient impaired gait ED Fall :   Yes   Use a mobility assistance device ED Fall :   No   Patient altered elimination ED Fall :   No   [facility] ED Fall Score :   2    Laurie Panda RNMelanee Spry - 11/27/2018 17:47 EDT   ED Advance Directive   Advance Directive :   No   Laurie Panda RN, Chip Boer S - 11/27/2018 17:47 EDT

## 2018-11-27 NOTE — ED Notes (Signed)
 ED Patient Summary              [facility] Emergency Department  [address] [place], [place] [address]  [phone]  Discharge Instructions (Patient)  _______________________________________     Name:Allison Patton, Allison Patton  DOB:  Apr 20, 1960                   MRN: [id number]                   FIN: [id number]  Reason For Visit: Medical problem reevaluation; ABN LABS  Final Diagnosis: Dizziness; Hypokalemia; Hyponatremia     Visit Date: 11/27/2018 14:43:00  Address: [address] [place] [address]  Phone: [phone]     Primary Care Provider:      Name: AZUCENA ELSPETH GREET      Phone: [phone]        Emergency Department Providers:         Primary Physician:            [facility] would like to thank you for allowing us  to assist you with your healthcare needs. The following includes patient education materials and information regarding your injury/illness.     Follow-up Instructions:  You were treated today on an emergency basis. If instructed, please contact your primary care provider to arrange for follow-up and for any further concerns. Whether you have been referred to your primary care doctor or a specialist, please follow-up as instructed.      If you do not have a doctor, you may call [phone] for assistance with finding a [facility] care physician or specialist. Staff is available to help schedule you an appointment.      Not sure where to go with questions about your health? We're here for you. The [facility] "Ask a Nurse" Line in staffed by experienced nurses and is a free service to the community, available Monday - Friday from 8AM to 5PM. Call [phone].      If your condition worsens before your follow-up with an outpatient physician, please return to the Emergency Department.       Printed Prescriptions:    Patient Education Materials:  Discharge Orders       Comment:             Allergy Info: No Known  Medication Allergies     Medication Information:  [facility] ED Physicians provided you with a complete list of medications post discharge, if you have been instructed to stop taking a medication please ensure you also follow up with this information to your Primary Care Physician.  Unless otherwise noted, patient will continue to take medications as prescribed prior to the Emergency Room visit.  Any specific questions regarding your chronic medications and dosages should be discussed with your physician(s) and pharmacist.          levothyroxine (levothyroxine 88 mcg (0.088 mg) oral capsule) 1 Capsules Oral (given by mouth) every day.  lisinopril (lisinopril 10 mg oral tablet) 1 Tabs Oral (given by mouth) every day.  melatonin Once a Day (at bedtime).      Medications Administered During Visit:              Medication Dose Route   Sodium Chloride 0.9% 1000 mL IV Piggyback   potassium chloride 40 mEq Oral          Major Tests and Procedures:  The  following procedures and tests were performed during your ED visit.  COMMON PROCEDURES%>  COMMON PROCEDURES COMMENTS%>          Laboratory Orders  Name Status Details   .SARS COV2 Ag FIA Completed Nasal Swab, Stat, ST - Stat, 11/27/18 16:15:00 EDT, 11/27/18 16:15:00 EDT, Nurse collect, KIMSEY-PA-C,  SARA LANE, Print label Y/N   .UA POC Completed Urine, RT, RT - Routine, Collected, 11/27/18 16:30:00 EDT, Nurse collect, 11/27/18 16:30:00 US Robinette, RAL POC Login   Ammonia Completed Blood, Stat, ST - Stat, 11/27/18 15:10:00 EDT, 11/27/18 15:10:00 EDT, Nurse collect, KIMSEY-PA-C,  SARA L[address], Print label Y/N   CBCDIFF Completed Blood, Stat, ST - Stat, 11/27/18 15:00:00 EDT, 11/27/18 15:00:00 EDT, Nurse collect, KIMSEY-PA-C,  SARA L[address], Print label Y/N   CMP Completed Blood, Stat, ST - Stat, 11/27/18 15:00:00 EDT, 11/27/18 15:00:00 EDT, Nurse collect, KIMSEY-PA-C,  SARA L[address], Print label Y/N   Na Ordered Blood, Stat, ST - Stat, 11/28/18 0:00:00 EDT, 11/28/18 0:00:00  EDT, Nurse collect, Print label Y/N   Na Ordered Blood, Stat, ST - Stat, 11/28/18 6:00:00 EDT, 11/28/18 6:00:00 EDT, Nurse collect, Print label Y/N   Na Ordered Blood, Stat, ST - Stat, 11/27/18 18:30:00 EDT, 11/27/18 18:30:00 EDT, Nurse collect, Print label Y/N   Na Ordered Blood, Stat, ST - Stat, 11/27/18 18:30:00 EDT, q6hr 3 doses, 11/28/18 11:59:00 EDT, Nurse collect, Print label Y/N   Na U Ordered Urine, Stat, ST - Stat, 11/27/18 17:04:00 EDT, 11/27/18 17:04:00 EDT, Nurse collect   Osmo U Ordered Urine, Stat, ST - Stat, 11/27/18 17:04:00 EDT, 11/27/18 17:04:00 EDT, Nurse collect   TSH Rfx Ordered Blood, 5 AM Draw, RT - Routine, 11/28/18 5:00:00 EDT, 11/28/18 5:00:00 EDT, Nurse collect, BOND-MD,  LYLE ASAL, Print label Y/N               Radiology Orders  Name Status Details   CT Head or Brain w/o Contrast Completed 11/27/18 15:10:00 EDT, STAT 1 hour or less, Reason: Confusion, acute, unexplained, Transport Mode: STRETCHER, Rad Type, pp_set_radiology_subspecialty, [id number], 8               Patient Care Orders  Name Status Details   Ambulate Ordered 11/27/18 16:02:00 EDT, Once, 11/27/18 16:02:00 EDT   Bed Request Communication Ordered 11/27/18 16:30:00 EDT icu, hyponatremia, hypokalemia, dizziness, BOND-MD,  BROOKE ERIKA   COVID-19 Status Ordered 11/27/18 16:15:10 EDT, NOT VALID FOR pharmacy, laboratory, radiology., 11/27/18 16:15:10 EDT, COVID-19 Not Detected   Change attending to Ordered 11/27/18 16:30:00 EDT, BOND-MD,  BROOKE ERIKA   DC ISO Order/Icons Ordered 11/27/18 17:31:33 EDT, 11/27/18 17:31:33 EDT   ED Assessment Adult Ordered 11/27/18 14:56:19 EDT, 11/27/18 14:56:19 EDT   ED Secondary Triage Completed 11/27/18 14:56:19 EDT, 11/27/18 14:56:19 EDT   ED Triage Adult Completed 11/27/18 14:43:30 EDT, 11/27/18 14:43:30 EDT   Notify Provider Ordered 11/27/18 16:15:10 EDT, This message can only be seen by Nursing, it is not visible to Pharmacy, Laboratory, or Radiology., 11/27/18 16:15:10 EDT   POC-Urine  Dipstick collect Completed 11/27/18 15:00:00 EDT, Once, 11/27/18 15:00:00 EDT   Patient Isolation Ordered 11/27/18 16:15:00 EDT, Contact and Airborne, Constant Indicator   Patient Specific Fall Safety Measures Ordered 11/27/18 17:48:04 EDT, Once, 11/27/18 17:48:04 EDT, ED Low Fall Risk Documentation   VTE Quality Measures Ordered 11/27/18 16:31:03 EDT, 11/27/18 16:31:03 EDT       ---------------------------------------------------------------------------------------------------------------------  [facility]) encourages you to self-enroll in the [facility] Patient Portal.  [facility] Patient Portal will allow you to manage  your personal health information securely from your own electronic device now and in the future.  To begin your Patient Portal enrollment process, please visit [email or web address] Click on "Sign up now" under [facility].  If you find that you need additional assistance on the [facility] Patient Portal or need a copy of your medical records, please call the [facility] Medical Records Office at [phone].  Comment:

## 2018-11-28 LAB — ADD ON LAB TEST

## 2018-11-28 LAB — CBC WITH AUTO DIFFERENTIAL
Absolute Baso #: 0 10*3/uL (ref 0.0–0.2)
Absolute Eos #: 0.1 10*3/uL (ref 0.0–0.5)
Absolute Lymph #: 1.2 10*3/uL (ref 1.0–3.2)
Absolute Mono #: 0.6 10*3/uL (ref 0.3–1.0)
Basophils %: 0.7 % (ref 0.0–2.0)
Eosinophils %: 1.1 % (ref 0.0–7.0)
Hematocrit: 34.8 % (ref 34.0–47.0)
Hemoglobin: 12.7 g/dL (ref 11.5–15.7)
Immature Grans (Abs): 0.05 10*3/uL (ref 0.00–0.06)
Immature Granulocytes: 0.9 % — ABNORMAL HIGH (ref 0.1–0.6)
Lymphocytes: 20.5 % (ref 15.0–45.0)
MCH: 33.2 pg (ref 27.0–34.5)
MCHC: 36.5 g/dL — ABNORMAL HIGH (ref 32.0–36.0)
MCV: 90.9 fL (ref 81.0–99.0)
MPV: 9.7 fL (ref 7.2–13.2)
Monocytes: 10.4 % (ref 4.0–12.0)
Neutrophils %: 66.4 % (ref 42.0–74.0)
Neutrophils Absolute: 3.8 10*3/uL (ref 1.6–7.3)
Platelets: 195 10*3/uL (ref 140–440)
RBC: 3.83 x10e6/mcL (ref 3.60–5.20)
RDW: 11.8 % (ref 11.0–16.0)
WBC: 5.7 10*3/uL (ref 3.8–10.6)

## 2018-11-28 LAB — BASIC METABOLIC PANEL
Anion Gap: 9 mmol/L (ref 2–17)
BUN: 4 mg/dL — ABNORMAL LOW (ref 6–20)
CO2: 32 mmol/L — ABNORMAL HIGH (ref 22–29)
Calcium: 9 mg/dL (ref 8.6–10.0)
Chloride: 84 mmol/L — ABNORMAL LOW (ref 98–107)
Creatinine: 0.5 mg/dL (ref 0.5–0.9)
GFR African American: 126 mL/min/{1.73_m2} (ref >=90)
GFR Non-African American: 109 mL/min/{1.73_m2} (ref >=90)
Glucose: 134 mg/dL — ABNORMAL HIGH (ref 70–99)
OSMOLALITY CALCULATED: 250 mOsm/kg — ABNORMAL LOW (ref 270–287)
Potassium: 3.7 mmol/L (ref 3.5–5.3)
Sodium: 125 mmol/L — ABNORMAL LOW (ref 135–145)

## 2018-11-28 LAB — T4, FREE: T4 Free: 1.13 ng/dL (ref 0.82–1.70)

## 2018-11-28 LAB — SODIUM: Sodium: 122 mmol/L — ABNORMAL LOW (ref 135–145)

## 2018-11-28 LAB — OSMOLALITY, URINE: Osmolality, Ur: 96 mOsm/kg (ref 50–1400)

## 2018-11-28 LAB — TSH WITH REFLEX TO FT4: TSH: 4.22 mcIU/mL — ABNORMAL HIGH (ref 0.358–3.740)

## 2018-11-28 LAB — MAGNESIUM
Magnesium: 1.1 mg/dL — ABNORMAL LOW (ref 1.6–2.6)
Magnesium: 1.2 mg/dL — ABNORMAL LOW (ref 1.6–2.6)

## 2018-11-28 LAB — POTASSIUM: Potassium: 3 mmol/L — CL (ref 3.5–5.3)

## 2018-11-28 NOTE — Progress Notes (Signed)
 Inpatient PT Examination - Text       Inpatient PT Examination Entered On:  11/28/2018 10:00 EDT    Performed On:  11/28/2018 9:54 EDT by Keneth, PT, Tori L               Reason for Treatment   Subjective Statement :   Pt and RN agreeabel to PT eval. Pt reports feeling much better. Feels ready to go home. Has been up to the bathroom and feels well. No to minimal dizziness.      *Reason for Referral :   Dizziness, falls, hyponatremia, hypokalemia    PMH: ETOH, hypothyroid     *Chief Complaint :   N/A     Keneth, PT, Metta CROME - 11/28/2018 9:54 EDT   General Info   Physical Therapy Orders :   PT Evaluation and Treatment Acute - 11/27/18 18:09:00 EDT, Stop date 11/27/18 18:09:00 EDT, A consult cannot be completed if there is a bedrest activity order; check for bedrest order.     Precautions RTF :    COVID-19 Status, 11/27/18 18:30:20 EDT, NOT VALID FOR pharmacy, laboratory, radiology., 11/27/18 18:30:20 EDT, COVID-19 Not Detected, Ordered   Communication to Nursing, 11/27/18 18:09:00 EDT, RD may manage/modify diet order and/or enteral nutrition per approved MNT protocol, 11/27/18 18:09:00 EDT, 11/27/18 18:09:00 EDT, Ordered   Notify Provider Vital Signs, 11/27/18 18:09:00 EDT, Refer to the Reporting Vital Sign Results to the Provider for Adult Acute Care Patients Policy., 11/27/18 18:09:00 EDT, Ordered   Notify Rapid Response Team, 11/27/18 18:09:00 EDT, For concerns regarding patient condition & notify MD, 11/27/18 18:09:00 EDT, 11/27/18 18:09:00 EDT, Ordered   Fall Risk Precautions, 11/27/18 18:08:00 EDT, Stop date 11/27/18 18:08:00 EDT, Ordered   Notify Provider, 11/27/18 18:08:00 EDT, Sedated or lethargic, 11/27/18 18:08:00 EDT, 11/27/18 18:08:00 EDT, Ordered   Notify Provider Vital Signs, 11/27/18 18:08:00 EDT, CIWA score greater than or equal to 12, 11/27/18 18:08:00 EDT, Ordered   Notify Provider Vital Signs, 11/27/18 18:08:00 EDT, HR greater than 100, 11/27/18 18:08:00 EDT, Ordered   Notify Provider Vital Signs, 11/27/18  18:08:00 EDT, DBP greater than 100, 11/27/18 18:08:00 EDT, Ordered   Notify Provider Vital Signs, 11/27/18 18:08:00 EDT, CIWA score greater than or equal to 8 two hours after PRN medication, 11/27/18 18:08:00 EDT, Ordered   Seizure Precautions, 11/27/18 18:08:00 EDT, Constant Order, Ordered   Change attending to, 11/27/18 16:30:00 EDT, BOND-MD,  LYLE ASAL, Ordered   COVID-19 Status, 11/27/18 16:15:10 EDT, NOT VALID FOR pharmacy, laboratory, radiology., 11/27/18 16:15:10 EDT, COVID-19 Not Detected, Discontinued   Notify Provider, 11/27/18 16:15:10 EDT, This message can only be seen by Nursing, it is not visible to Pharmacy, Laboratory, or Radiology., 11/27/18 16:15:10 EDT, Ordered   Patient Isolation, 11/27/18 16:15:00 EDT, Contact and Airborne, Ordered     Orientation Assessment :   Oriented x 4   Affect/Behavior :   Appropriate   Basic Command Following :   Intact   Safety/Judgment :   Intact   Pain Present :   No actual or suspected pain   [place], PT, Tori L - 11/28/2018 9:54 EDT   Problem List   (As Of: 11/28/2018 10:00:11 EDT)   Problems(Active)    HTN (hypertension) (SNOMED CT  :8784255987 )  Name of Problem:   HTN (hypertension) ; Recorder:   RICHARDSON, RN, [employer]; Confirmation:   Confirmed ; Classification:   Patient Stated ; Code:   8784255987 ; Contributor System:   PowerChart ; Last Updated:   06/10/2017 15:04  EST ; Life Cycle Date:   06/10/2017 ; Life Cycle Status:   Active ; Vocabulary:   SNOMED CT        Thyroid disease (SNOMED CT  :75675981 )  Name of Problem:   Thyroid disease ; Recorder:   RICHARDSON, RN, JENNIFER; Confirmation:   Confirmed ; Classification:   Patient Stated ; Code:   75675981 ; Contributor System:   PowerChart ; Last Updated:   06/10/2017 15:04 EST ; Life Cycle Date:   06/10/2017 ; Life Cycle Status:   Active ; Vocabulary:   SNOMED CT          Diagnoses(Active)    Alcohol abuse  Date:   11/28/2018 ; Diagnosis Type:   Discharge ; Confirmation:   Confirmed ; Clinical Dx:   Alcohol abuse  ; Classification:   Medical ; Clinical Service:   Non-Specified ; Code:   ICD-10-CM ; Probability:   0 ; Diagnosis Code:   F10.10      Dizziness  Date:   11/27/2018 ; Diagnosis Type:   Discharge ; Confirmation:   Confirmed ; Clinical Dx:   Dizziness ; Classification:   Medical ; Clinical Service:   Non-Specified ; Code:   ICD-10-CM ; Probability:   0 ; Diagnosis Code:   R42      Hypokalemia  Date:   11/27/2018 ; Diagnosis Type:   Discharge ; Confirmation:   Confirmed ; Clinical Dx:   Hypokalemia ; Classification:   Medical ; Clinical Service:   Non-Specified ; Code:   ICD-10-CM ; Probability:   0 ; Diagnosis Code:   E87.6      Hyponatremia  Date:   11/27/2018 ; Diagnosis Type:   Discharge ; Confirmation:   Confirmed ; Clinical Dx:   Hyponatremia ; Classification:   Medical ; Clinical Service:   Non-Specified ; Code:   ICD-10-CM ; Probability:   0 ; Diagnosis Code:   E87.1      Other reduced mobility  Date:   11/28/2018 ; Diagnosis Type:   Other ; Confirmation:   Differential ; Clinical Dx:   Other reduced mobility ; Classification:   Interdisciplinary ; Clinical Service:   Non-Specified ; Code:   ICD-10-CM ; Probability:   0 ; Diagnosis Code:   Z74.09        Home Environment   Living Environment :   Home Environment  Sensory Deficits:  None  Performed By:  JONEEN, RN, MEGAN M 11/27/2018     Lives With :   Carlin Cornea, PT, Tori L - 11/28/2018 9:54 EDT   Stairs     Outside Stairs          Number of Stairs :    0                 [place], [place], Metta CROME - 11/28/2018 9:54 EDT         Detail Areas of Responsibilities :   Cares fo her dog   Cornea ALMETA Metta CROME - 11/28/2018 9:54 EDT   Home Environment II   Living Environment :   Home Environment  Sensory Deficits:  None  Performed By:  JONEEN RN, MEGAN M 11/27/2018     [place], PT, Tori L - 11/28/2018 9:54 EDT   Prior Functional Status   ADL :   Independent   Mobility :   Independent   Instrumental ADL :   Independent   Cognitive-Communication Skills :   Benard Cornea, PT, Metta  L -  11/28/2018 9:54 EDT   LE Range/Strength   LE Overall Range of Motion Grid   Left Lower Extremity Active Range :   Within functional limits   Right Lower Extremity Active Range :   Within functional limits   [place], PT, Tori L - 11/28/2018 9:54 EDT   Lt Lower Extremity Strength :   Within functional limits   Rt Lower Extremity Strength :   Within functional limits   [place], [place], Tori L - 11/28/2018 9:54 EDT   Mobility   Mobility Grid   Roll Left :   Rehab Complete independence   Roll Right :   Rehab Complete independence   Supine to Sit :   Rehab Complete independence   Sit to Supine :   Rehab Complete independence   Scooting :   Rehab Complete independence   Edge of Bed Assist :   Rehab Complete independence   Transfer Sit to Stand :   Rehab Complete independence   Transfer Stand to Sit :   Rehab Complete independence   Transfer Toilet :   Rehab Complete independence   Keneth, PT, Tori L - 11/28/2018 9:54 EDT   Ambulation Level Acute :   Complete independence   Ambulation Quality :   Amb X219ft ind   Keneth, PT, Tori L - 11/28/2018 9:54 EDT   Vital Signs   Peripheral Pulse Rate :   94 bpm   Systolic/ Diastolic  BP :   894 mmHg   Diastolic Blood Pressure :   76 mmHg   O2 Saturation :   97 %   O2 Therapy :   Room air   Peripheral Pulse Rate :   94 bpm   Systolic/ Diastolic BP :   120 mmHg   Diastolic Blood Pressure with Activity :   81 mmHg   O2 Saturation :   93 % (LOW)    Keneth, PT, Tori L - 11/28/2018 9:54 EDT   Assessment   Discharge Recommendations :   DC home     D/CTransportation Recommendations :   No stretcher   D/C Transportation Recommendations Reviewed :   Yes   PT Treatment Recommendations :   Pt is a 58 y/o female admitted 11/27/18 c hyponatremia and hypokalemia. Pt currently demonstrating ind c functional mobility. No acute PT needs at this time.      [place], [place], Metta CROME - 11/28/2018 9:54 EDT   Long Term Goals   PT LT Goals Reviewed :   Chaney Keneth, PT, Metta CROME - 11/28/2018 9:54 EDT   Short Term Goals   PT ST Goals Reviewed  :   Chaney Keneth, PT, Tori L - 11/28/2018 9:54 EDT   Plan   PT Frequency Acute :   Once   Treatment Plan/Goals Established With Patient/Caregiver :   Yes   Evaluation Complete :   Yes   Keneth, PT, Tori L - 11/28/2018 9:54 EDT   Time Spent With Patient   PT Time In :   8:58 EST   PT Time Out :   9:08 EST   PT Individual Eval Time, Low Complexity :   10 minutes   PT Evaluation Units, Low Complexity :   1 Unit   PT Total Untimed Min :   10    PT Total Treatment Time Acute/OP :   10    [place], PT, Tori L - 11/28/2018 9:54 EDT

## 2018-11-28 NOTE — Nursing Note (Signed)
Nursing Discharge Summary - Text       Nursing Discharge Summary Entered On:  11/28/2018 10:11 EDT    Performed On:  11/28/2018 10:09 EDT by Konrad Penta, RN, Ivory Broad               DC Information   Discharge To :   Home independently   Renee Rival - 11/28/2018 10:09 EDT   Education   Responsible Learner(s) :   No Data Available     Teaching Method :   Explanation   Konrad Penta RNIvory Broad - 11/28/2018 10:09 EDT   Post-Hospital Education Adult Grid   Activity Expectations :   [place]on Gammon understanding   Diagnostic Results :   Verbalizes understanding   Pain Management :   Verbalizes understanding   Plan of Care :   Verbalizes understanding   Substance Abuse :   Verbalizes understanding   When to Call Health Care Provider :   [place] understanding   [place], RN, Ivory Broad - 11/28/2018 10:09 EDT   Health Maintenance Education Adult Grid   Diet/Nutrition :   Verbalizes understanding   SPEEDY, RN, Ivory Broad - 11/28/2018 10:09 EDT   Time Spent Educating Patient :   15 minutes   SPEEDY, RN, Tresa Endo M - 11/28/2018 10:09 EDT

## 2018-11-28 NOTE — Progress Notes (Signed)
BPN    [facility] Physician - Brief Progress Note  PERMANENT  11/28/2018 01:30    [facility]  [place] - ICU - 10 - [place], [place] [facility])    [place], [place]    Date of Service 11/28/2018 01:30    HPI/Events of Note AICU Provider Assessment Note    58 y/o F admitted to ICU from ED with symptomatic hyponatremia. Pt on CIWA protocol. Pt presented with dizziness. Pt also on Chlorthalidone and decreased PO intake and mostly ETOH only.    PMH: alcohol abuse, hypothyroidism, HTN    Labs: plt 212, wbc 6.8, Hgb 13.6, Na 117, K 2.4 to 3.0, Cl 73, CO2 32, glu 144, Cr 0.9, AST 53, ALT 48, TB 0.73, ammonia 42, UA no pyuria    CT head: no acute findings    Assessment and Plan:    -HOB elevated, aspiration and seizure precautions  -CIWA  -thiamine and folic acid  -serial Na checks  -holding Chlorthalidone  -monitor I/O  -replace K  -urine Lytes, Osm, Cr and Serum Osm  -check Mg level      __Y___   Video Assessment performed  __Y___   Most recent labs reviewed  __Y___   Vital Signs reviewed  __Y___   Best Practices addressed:                 VTE prophylaxis: SCD                 SUP (when indicated): NA                 Glycemic control: < 180                      Please notify bedside physician when present or [facility] if glc > 180 X 2                 Sepsis guidelines:                 Lung protective strategy                 Targeted Temperature Management:    _____     Damaris Schooner with bedside RN  __Y___     Orders written      Contact [facility] for any needs if bedside physician is not present.      Interventions Major-Electrolyte abnormality - evaluation and management        Electronically Signed by: Toni Amend (MD) on 11/28/2018 01:40

## 2018-11-28 NOTE — Discharge Summary (Signed)
Inpatient Clinical Summary             [facility]  Post-Acute Care Transfer Instructions  PERSON INFORMATION   Name: Allison Patton, Allison Patton  MRN: [id number]    FIN#: [id number]   PHYSICIANS  Admitting Physician: Retta Diones  Attending Physician: Retta Diones   PCP: Sherril Cong  Discharge Diagnosis:  Alcohol abuse; Dizziness; Hypokalemia; Hyponatremia  Comment:       PATIENT EDUCATION INFORMATION  Instructions:               Medication Leaflets:               Follow-up:                           With: Address: When:   [place] [address]  [phone] Business (1) Within 1 to 2 weeks   Comments:   please call to make appointment                             MEDICATION LIST  Medication Reconciliation at Discharge:          New Medications  Other Medications  folic acid (folic acid 1 mg oral tablet) 1 Tabs Oral (given by mouth) every day.  Last Dose:____________________  multivitamin with minerals (Multiple Vitamins with Minerals oral capsule) Oral (given by mouth) every day.  Last Dose:____________________  thiamine (thiamine 100 mg oral tablet) 1 Tabs Oral (given by mouth) every day.  Last Dose:____________________  Medications that have not changed  Other Medications  ALPRAZolam (ALPRAZolam 1 mg oral tablet) 1 Tabs Oral (given by mouth) every day as needed for anxiety.  Last Dose:____________________  levothyroxine (levothyroxine 88 mcg (0.088 mg) oral capsule) 1 Capsules Oral (given by mouth) every day.  Last Dose:____________________  lisinopril (lisinopril 10 mg oral tablet) 1 Tabs Oral (given by mouth) every day.  Last Dose:____________________  melatonin Once a Day (at bedtime).  Last Dose:____________________         Patient???s Final Home Medication List Upon Discharge:           ALPRAZolam (ALPRAZolam 1 mg oral tablet) 1 Tabs Oral (given by mouth) every day as needed for anxiety.  folic acid (folic acid 1 mg oral tablet) 1 Tabs Oral  (given by mouth) every day.  levothyroxine (levothyroxine 88 mcg (0.088 mg) oral capsule) 1 Capsules Oral (given by mouth) every day.  lisinopril (lisinopril 10 mg oral tablet) 1 Tabs Oral (given by mouth) every day.  melatonin Once a Day (at bedtime).  multivitamin with minerals (Multiple Vitamins with Minerals oral capsule) Oral (given by mouth) every day.  thiamine (thiamine 100 mg oral tablet) 1 Tabs Oral (given by mouth) every day.         Comment:       ORDERS          Order Name Order Details   Discharge Patient 11/28/18 9:38:00 EDT, Discharge Home/Self Care

## 2018-11-28 NOTE — Case Communication (Signed)
CM Discharge Planning Assessment - Text       CM Progress Note Entered On:  11/29/2018 11:09 EDT    Performed On:  11/29/2018 11:07 EDT by Claudina Lick A               CM Progress Note   CM Progress Note :   11/29/2018: L. Plumley LMSW: received a call from Ms. Vandall regarding her medications. CM unable to find original CM phone number, but able to connect her with a [facility] CM in hopes she could direct patient in the right direction. Provided patient with my phone number in case she needed a different phone number      Primus Bravo - 11/29/2018 11:07 EDT

## 2018-11-28 NOTE — Discharge Summary (Signed)
Inpatient Patient Summary                 [facility]  [address] [place] [address]  [phone]  Patient Discharge Instructions     Name: Allison Patton, Allison Patton  Current Date: 11/28/2018 09:39:38  DOB: 1960/08/11 [id number] [phone]  Patient Address: [place] [address]  Patient Phone: [phone]  Primary Care Provider:  Name: Sherril Cong  Phone: [phone]  Immunizations Provided:      Discharge Diagnosis: Alcohol abuse; Dizziness; Hypokalemia; Hyponatremia  Discharged To: TO, ANTICIPATED%>  Home Treatments: TREATMENTS, ANTICIPATED%>  Devices/Equipment: EQUIPMENT REHAB%>  Post Hospital Services: HOSPITAL SERVICES%>  Professional Skilled Services: SKILLED SERVICES%>  Education administrator and Community Resources: SERV AND COMM RES, ANTICIPATED%>  Mode of Discharge Transportation: TRANSPORTATION%>  Discharge Orders:          Discharge Patient 11/28/18 9:38:00 EDT, Discharge Home/Self Care         Comment:   Medications  During the course of your visit, your medication list was updated with the most current information. The details of those changes are reflected below:          New Medications  Other Medications  folic acid (folic acid 1 mg oral tablet) 1 Tabs Oral (given by mouth) every day.  Last Dose:____________________  multivitamin with minerals (Multiple Vitamins with Minerals oral capsule) Oral (given by mouth) every day.  Last Dose:____________________  thiamine (thiamine 100 mg oral tablet) 1 Tabs Oral (given by mouth) every day.  Last Dose:____________________  Medications that have not changed  Other Medications  ALPRAZolam (ALPRAZolam 1 mg oral tablet) 1 Tabs Oral (given by mouth) every day as needed for anxiety.  Last Dose:____________________  levothyroxine (levothyroxine 88 mcg (0.088 mg) oral capsule) 1 Capsules Oral (given by mouth) every day.  Last Dose:____________________  lisinopril (lisinopril 10 mg oral tablet) 1 Tabs Oral (given  by mouth) every day.  Last Dose:____________________  melatonin Once a Day (at bedtime).  Last Dose:____________________      [facility] would like to thank you for allowing [place] to assist you with your healthcare needs. The following includes patient education materials and information regarding your injury/illness.  Allison Patton has been given the following list of follow-up instructions, prescriptions, and patient education materials:  Follow-up Instructions:              With: Address: When:   [place] [address]  [phone] Business (1) Within 1 to 2 weeks   Comments:   please call to make appointment                  It is important to always keep an active list of medications available so that you can share with other providers and manage your medications appropriately. As an additional courtesy, we are also providing you with your final active medications list that you can keep with you.           ALPRAZolam (ALPRAZolam 1 mg oral tablet) 1 Tabs Oral (given by mouth) every day as needed for anxiety.  folic acid (folic acid 1 mg oral tablet) 1 Tabs Oral (given by mouth) every day.  levothyroxine (levothyroxine 88 mcg (0.088 mg) oral capsule) 1 Capsules Oral (given by mouth) every day.  lisinopril (lisinopril 10 mg oral tablet) 1 Tabs Oral (given by mouth) every day.  melatonin Once a Day (at bedtime).  multivitamin with minerals (Multiple Vitamins with Minerals oral capsule) Oral (given by mouth) every day.  thiamine (thiamine 100 mg oral tablet) 1 Tabs Oral (given by mouth) every day.      Take only the medications listed above. Contact your doctor prior to taking any medications not on this list.  Discharge instructions, if any, will display below     Instructions for Diet: INSTRUCTIONS FOR DIET%>A Healthy Diet  Instructions for Supplements: SUPPLEMENT INSTRUCTIONS%>  Instructions for Activity: INSTRUCTIONS FOR ACTIVITY%>As Tolerated  Instructions for  Wound Care: INSTRUCTIONS FOR WOUND CARE%>     Medication leaflets, if any, will display below    Patient education materials, if any, will display below       IS IT A STROKE?  Act FAST and Check for these signs:    FACE                          Does the face look uneven?    ARM                          Does one arm drift down?    SPEECH                     Does their speech sound strange?    TIME                          Call 9-1-1 at any sign of stroke  Heart Attack Signs  Chest discomfort: Most heart attacks involve discomfort in the center of the chest and lasts more than a few minutes, or goes away and comes back. It can feel like uncomfortable pressure, squeezing, fullness or pain.  Discomfort in upper body: Symptoms can include pain or discomfort in one or both arms, back, neck, jaw or stomach.  Shortness of breath: With or without discomfort.  Other signs: Breaking out in a cold sweat, nausea, or lightheaded.  Remember, MINUTES DO MATTER. If you experience any of these heart attack warning signs, call 9-1-1 to get immediate medical attention!     ---------------------------------------------------------------------------------------------------------------------  [facility] allows you to manage your health, view your test results, and retrieve your discharge documents from your hospital stay securely and conveniently from your computer.  To begin the enrollment process, visit [email or web address]. Click on ???Sign up now??? under [facility].

## 2018-11-28 NOTE — Nursing Note (Signed)
Nursing Discharge Summary - Text       Physician Discharge Summary Entered On:  11/28/2018 9:39 EDT    Performed On:  11/28/2018 9:38 EDT by Ala Bent               DC Information   Provider Instructions for Diet :   A Healthy Diet   Ala Bent - 11/28/2018 9:38 EDT     Ala Bent - 11/28/2018 9:39 EDT     Provider Instructions for Activity :   As Tolerated   Jozlin Bently-MD,  Carmon Sails - 11/28/2018 9:38 EDT

## 2018-11-28 NOTE — Case Communication (Signed)
CM Discharge Planning Assessment - Text       CM Discharge Plan Entered On:  11/28/2018 10:41 EDT    Performed On:  11/28/2018 10:40 EDT by Konrad Penta, RN, Ivory Broad               CM Discharge Plan   Discharge To :   Home independently   [place], RN, Ivory Broad - 11/28/2018 10:40 EDT

## 2018-11-28 NOTE — Case Communication (Signed)
CM Discharge Planning Assessment - Text       CM Discharge Plan Entered On:  11/28/2018 10:38 EDT    Performed On:  11/28/2018 10:38 EDT by Konrad Penta, RN, Ivory Broad               CM Discharge Plan   Discharge To :   Home independently   Renee Rival - 11/28/2018 10:38 EDT   D/C Transportation Recommendations   D/CTransportation Recommendations :   No stretcher   SPEEDY, RN, Ivory Broad - 11/28/2018 10:38 EDT

## 2019-03-11 LAB — CBC WITH AUTO DIFFERENTIAL
Absolute Baso #: 0.1 10*3/uL (ref 0.0–0.2)
Absolute Eos #: 0.2 10*3/uL (ref 0.0–0.5)
Absolute Lymph #: 1.6 10*3/uL (ref 1.0–3.2)
Absolute Mono #: 1.3 10*3/uL — ABNORMAL HIGH (ref 0.3–1.0)
Basophils %: 0.6 % (ref 0.0–2.0)
Eosinophils %: 2.2 % (ref 0.0–7.0)
Hematocrit: 42.6 % (ref 34.0–47.0)
Hemoglobin: 14.9 g/dL (ref 11.5–15.7)
Immature Grans (Abs): 0.03 10*3/uL (ref 0.00–0.06)
Immature Granulocytes: 0.4 % (ref 0.1–0.6)
Lymphocytes: 18.4 % (ref 15.0–45.0)
MCH: 32.7 pg (ref 27.0–34.5)
MCHC: 35 g/dL (ref 32.0–36.0)
MCV: 93.4 fL (ref 81.0–99.0)
MPV: 9.9 fL (ref 7.2–13.2)
Monocytes: 15.1 % — ABNORMAL HIGH (ref 4.0–12.0)
Neutrophils %: 63.3 % (ref 42.0–74.0)
Neutrophils Absolute: 5.4 10*3/uL (ref 1.6–7.3)
Platelets: 205 10*3/uL (ref 140–440)
RBC: 4.56 x10e6/mcL (ref 3.60–5.20)
RDW: 11.2 % (ref 11.0–16.0)
WBC: 8.5 10*3/uL (ref 3.8–10.6)

## 2019-03-11 LAB — COMPREHENSIVE METABOLIC PANEL
ALT: 50 U/L — ABNORMAL HIGH (ref 0–33)
AST: 64 U/L — ABNORMAL HIGH (ref 0–32)
Albumin/Globulin Ratio: 1 mmol/L (ref 1.00–2.00)
Albumin: 4.6 g/dL (ref 3.5–5.2)
Alk Phosphatase: 78 U/L (ref 35–117)
Anion Gap: 18 mmol/L — ABNORMAL HIGH (ref 2–17)
BUN: 14 mg/dL (ref 6–20)
CO2: 25 mmol/L (ref 22–29)
Calcium: 9.8 mg/dL (ref 8.6–10.0)
Chloride: 86 mmol/L — ABNORMAL LOW (ref 98–107)
Creatinine: 0.7 mg/dL (ref 0.5–0.9)
GFR African American: 112 mL/min/{1.73_m2} (ref >=90)
GFR Non-African American: 97 mL/min/{1.73_m2} (ref >=90)
Globulin: 3 g/dL (ref 1.9–4.4)
Glucose: 113 mg/dL — ABNORMAL HIGH (ref 70–99)
OSMOLALITY CALCULATED: 260 mOsm/kg — ABNORMAL LOW (ref 270–287)
Potassium: 3.4 mmol/L — ABNORMAL LOW (ref 3.5–5.3)
Sodium: 129 mmol/L — ABNORMAL LOW (ref 135–145)
Total Bilirubin: 2.46 mg/dL — ABNORMAL HIGH (ref 0.00–1.20)
Total Protein: 8 g/dL (ref 6.4–8.3)

## 2019-03-11 LAB — URINALYSIS W/ RFLX MICROSCOPIC
Blood, Urine: NEGATIVE
Glucose, UA: NEGATIVE mg/dL
Nitrite, Urine: NEGATIVE
RBC, UA: NONE SEEN /HPF (ref 0–2)
Specific Gravity, UA: 1.02 (ref 1.003–1.035)
Urobilinogen, Urine: 2 EU/dL — AB
pH, UA: 7.5 (ref 4.5–8.0)

## 2019-03-11 LAB — LIPASE: Lipase: 58 U/L (ref 13–60)

## 2019-03-11 NOTE — Discharge Summary (Signed)
 ED Clinical Summary                     [facility]  [address] [place], [place] [address]  [phone]          PERSON INFORMATION  Name: Allison Patton, Allison Patton Age:  58 Years DOB: 1961-01-06   Sex: Female Language: English PCP: AZUCENA ELSPETH GREET   Marital Status: Single Phone: [phone] Med Service: MED-Medicine   MRN: [id number] Acct# [id number] Arrival: 03/11/2019 08:10:00   Visit Reason: Abdominal pain; Vomiting; VOMITING,L SIDE ABDOM PAIN Acuity: 3 LOS: 000 02:37   Address:    [address] [place] [address]   Diagnosis:    Dehydration; Elevated LFTs; Elevated bilirubin; Hepatic steatosis  Medications:          New Medications  Printed Prescriptions  ondansetron (Zofran ODT 4 mg oral tablet, disintegrating) 1 Tabs Oral (given by mouth) 3 times a day. Refills: 0.  Last Dose:____________________  Medications that have not changed  Other Medications  ALPRAZolam (ALPRAZolam 1 mg oral tablet) 1 Tabs Oral (given by mouth) every day as needed for anxiety.  Last Dose:____________________  FLUoxetine (FLUoxetine 20 mg oral capsule) 1 Capsules Oral (given by mouth) every day., SOUND ALIKE / LOOK ALIKE - VERIFY DRUG  Last Dose:____________________  folic acid (folic acid 1 mg oral tablet) 1 Tabs Oral (given by mouth) every day for 30 Days. Refills: 0.  Last Dose:____________________  levothyroxine (levothyroxine 88 mcg (0.088 mg) oral capsule) 1 Capsules Oral (given by mouth) every day.  Last Dose:____________________  lisinopril (lisinopril 10 mg oral tablet) 1 Tabs Oral (given by mouth) every day.  Last Dose:____________________  melatonin Once a Day (at bedtime).  Last Dose:____________________  multivitamin with minerals (Multiple Vitamins with Minerals oral capsule) Oral (given by mouth) every day.  Last Dose:____________________  thiamine (thiamine 100 mg oral tablet) 1 Tabs Oral (given by mouth) every day.  Last  Dose:____________________  triamterene-hydrochlorothiazide (triamterene-hydrochlorothiazide 37.5 mg-25 mg oral capsule) 1 Capsules Oral (given by mouth) every day.  Last Dose:____________________      Medications Administered During Visit:                Medication Dose Route   Sodium Chloride 0.9% 1000 mL IV Piggyback   ondansetron 4 mg IV Push   ketorolac 15 mg IV Push   Sodium Chloride 0.9% 1000 mL IV Piggyback               Allergies      No Known Medication Allergies      Major Tests and Procedures:  The following procedures and tests were performed during your ED visit.  COMMON PROCEDURES%>  COMMON PROCEDURES COMMENTS%>                PROVIDER INFORMATION               Provider Role Assigned Sampson MARINE ELSIE ONEIDA ED Provider 03/11/2019 08:20:11    KERNEY, RN, [facility] ED Nurse 03/11/2019 08:30:18        Attending Physician:  MARINE ELSIE T      Admit Doc  RIVERS-MD,  ELSIE ONEIDA     Consulting Doc       VITALS INFORMATION  Vital Sign Triage Latest   Temp Oral ORAL_1%> ORAL%>   Temp Temporal TEMPORAL_1%> TEMPORAL%>   Temp Intravascular INTRAVASCULAR_1%> INTRAVASCULAR%>   Temp Axillary AXILLARY_1%> AXILLARY%>  Temp Rectal RECTAL_1%> RECTAL%>   02 Sat 97 % 97 %   Respiratory Rate RATE_1%> RATE%>   Peripheral Pulse Rate PULSE RATE_1%>86 bpm PULSE RATE%>   Apical Heart Rate HEART RATE_1%> HEART RATE%>   Blood Pressure BLOOD PRESSURE_1%>/ BLOOD PRESSURE_1%>110 mmHg BLOOD PRESSURE%> / BLOOD PRESSURE%>90 mmHg                 Immunizations      No Immunizations Documented This Visit          DISCHARGE INFORMATION   Discharge Disposition: H Outpt-Sent Home   Discharge Location:  Home   Discharge Date and Time:  03/11/2019 10:47:09   ED Checkout Date and Time:  03/11/2019 10:47:09     DEPART REASON INCOMPLETE INFORMATION               Depart Action Incomplete Reason   Interactive View/I&O Recently assessed               Problems      No Problems Documented              Smoking Status      Former  smoker, quit more than 30 days ago         PATIENT EDUCATION INFORMATION  Instructions:     Dehydration, Adult     Follow up:                   With: Address: When:   [facility] Call for appt & office location   [phone] Business (1) Within 1 week              ED PROVIDER DOCUMENTATION     Patient:   Allison Patton, Allison Patton             MRN: [id number]            FIN: [id number]               Age:   65 years     Sex:  Female     DOB:  [birth year]   Associated Diagnoses:   Elevated bilirubin; Elevated LFTs; Dehydration; Hepatic steatosis   Author:   MARINE FALLOW T      Basic Information   Time seen: Provider Seen (ST)   ED Provider/Time:    MARINE FALLOW T / 03/11/2019 08:20  .   Additional information: Chief Complaint from Nursing Triage Note   Chief Complaint  Chief Complaint: c/o VOMITING SINCE SUNDAY, RIGHT SIDE PAIN. GASSY STOMACH.  WAS ON PROZAC  FOR 2 WEEKS, STOPPED IT ON MONDAY. VOMITING HAS EASED. VERY ANXIOUS (03/11/19 08:23:00).      History of Present Illness   The patient presents with Is a 58 year old female with history of anxiety.  She is presenting here for vomiting, diarrhea, and abdominal pain.  The symptoms began about 3 days ago.  She states she has had multiple episodes of vomiting and feels quite gassy.  She states the pain is kind of all over, but when pressed she states it is mostly in the right upper quadrant.  She has had no fevers or chills.  There are no chest pain or shortness of breath.  There is no back pain.  She has no dysuria or hematuria.  No bloody stools..        Review of Systems   Constitutional symptoms:  No fever, no chills.    Skin symptoms:  No rash, no lesion.  Eye symptoms:  Vision unchanged, No blurred vision,    ENMT symptoms:  No sore throat, no sinus pain.    Respiratory symptoms:  No shortness of breath, no cough.    Cardiovascular symptoms:  No chest pain, no palpitations, no syncope, no diaphoresis, no peripheral edema.     Gastrointestinal symptoms:  Abdominal pain, nausea, vomiting, diarrhea, No constipation,    Genitourinary symptoms:  No dysuria, no hematuria.    Musculoskeletal symptoms:  No back pain, no Muscle pain.    Neurologic symptoms:  No headache, no dizziness.    Allergy/immunologic symptoms:  No recurrent infections, no impaired immunity.       Health Status   Allergies:    Allergic Reactions (Selected)  No Known Medication Allergies.   Medications:  (Selected)   Prescriptions  Prescribed  folic acid 1 mg oral tablet: 1 mg, 1 tabs, Oral, Daily, for 30 days, 30 tabs, 0 Refill(s)  Documented Medications  Documented  ALPRAZolam 1 mg oral tablet: 1 mg, 1 tabs, Oral, Daily, PRN: for anxiety, 0 Refill(s)  FLUoxetine 20 mg oral capsule: 20 mg, 1 caps, Oral, Daily, 90 caps, 0 Refill(s)  Multiple Vitamins with Minerals oral capsule: Oral, Daily, 0 Refill(s)  levothyroxine 88 mcg (0.088 mg) oral capsule: 88 mcg, 1 caps, Oral, Daily, 30 caps, 0 Refill(s)  lisinopril 10 mg oral tablet: 10 mg, 1 tabs, Oral, Daily, 0 Refill(s)  melatonin: Once a Day (at bedtime), 0 Refill(s)  thiamine 100 mg oral tablet: 100 mg, 1 tabs, Oral, Daily, 0 Refill(s)  triamterene-hydrochlorothiazide 37.5 mg-25 mg oral capsule: 1 caps, Oral, Daily, 90 caps, 0 Refill(s).      Past Medical/ Family/ Social History   Surgical history: Reviewed as documented in chart.   Family history: Reviewed as documented in chart.   Social history: Reviewed as documented in chart.   Problem list:    Active Problems (3)  Depression   HTN (hypertension)   Thyroid disease   .      Physical Examination               Vital Signs   Vital Signs   03/11/2019 8:30 EST Systolic Blood Pressure 158 mmHg  HI    Diastolic Blood Pressure 110 mmHg  >HHI    Peripheral Pulse Rate 86 bpm    Heart Rate Monitored 85 bpm    Mean Arterial Pressure, Cuff 135 mmHg  HI    SpO2 97 %   03/11/2019 8:23 EST Systolic Blood Pressure 158 mmHg  HI    Diastolic Blood Pressure 110 mmHg  >HHI    Temperature  Oral 37.2 degC    Heart Rate Monitored 87 bpm    Respiratory Rate 17 br/min    SpO2 97 %   .   Measurements   03/11/2019 8:28 EST Body Mass Index est meas 29.07 kg/m2    Body Mass Index Measured 29.07 kg/m2   03/11/2019 8:23 EST Height/Length Measured 170 cm    Weight Dosing 84 kg   .   General:  Alert, no acute distress.    Skin:  Warm, dry, intact.    Head:  Normocephalic, atraumatic.    Eye:  Extraocular movements are intact, normal conjunctiva.    Ears, nose, mouth and throat:  Oral mucosa moist.   Cardiovascular:  Normal peripheral perfusion, No edema.    Respiratory:  Respirations are non-labored, Symmetrical chest wall expansion.    Gastrointestinal:  Soft, Non distended, Tenderness: Mild, right upper quadrant.  Neurological:  No focal neurological deficit observed, normal motor observed, normal speech observed.    Psychiatric:  Cooperative, appropriate mood & affect.       Medical Decision Making   Rationale:  58 year old female presents here for nausea, vomiting, diarrhea and some generalized abdominal pain that is mostly in the right upper quadrant.  Will get right upper quadrant ultrasound on her.  Will get basic labs as well as urinalysis.  She is extremely anxious, but nontoxic-appearing.SABRA   Results review:  Lab results : Lab View   03/11/2019 9:47 EST UA Color Yellow    UA Appear Clear    UA Glucose Negative    UA Bili Moderate    UA Ketones Trace    UA Spec Grav 1.020    UA Blood Negative    UA pH 7.5    Protein U Trace    UA Urobilinogen 2.0 EU/dL    UA Nitrite Negative    UA Leuk Est Trace    WBC U 3-5 /HPF    RBC U Not Seen /HPF    Sq Epi U Few /LPF   03/11/2019 9:11 EST Estimated Creatinine Clearance 99.82 mL/min   03/11/2019 8:44 EST WBC 8.5 x10e3/mcL    RBC 4.56 x10e6/mcL    Hgb 14.9 g/dL    HCT 57.3 %    MCV 06.5 fL    MCH 32.7 pg    MCHC 35.0 g/dL    RDW 88.7 %    Platelet 205 x10e3/mcL    MPV 9.9 fL    Neutro Auto 63.3 %    Neutro Absolute 5.4 x10e3/mcL    Immature Grans Percent 0.4 %     Immature Grans Absolute 0.03 x10e3/mcL    Lymph Auto 18.4 %    Lymph Absolute 1.6 x10e3/mcL    Mono Auto 15.1 %  HI    Mono Absolute 1.3 x10e3/mcL  HI    Eosinophil Percent 2.2 %    Eos Absolute 0.2 x10e3/mcL    Basophil Auto 0.6 %    Baso Absolute 0.1 x10e3/mcL    Sodium Lvl 129 mmol/L  LOW    Potassium Lvl 3.4 mmol/L  LOW    Chloride 86 mmol/L  LOW    CO2 25 mmol/L    Glucose Random 113 mg/dL  HI    BUN 14 mg/dL    Creatinine Lvl 0.7 mg/dL    AGAP 18 mmol/L  HI    Osmolality Calc 260 mOsm/kg  LOW    Calcium Lvl 9.8 mg/dL    Protein Total 8.0 g/dL    Albumin Lvl 4.6 g/dL    Globulin Calc 3.0 g/dL    AG Ratio Calc 8.99 mmol/L    Alk Phos 78 unit/L    AST 64 unit/L  HI    ALT 50 unit/L  HI    eGFR AA 112 mL/min/1.38m    eGFR Non-AA 97 mL/min/1.55m    Bili Total 2.46 mg/dL  HI    Lipase Lvl 58 unit/L   03/11/2019 8:28 EST Estimated Creatinine Clearance 139.75 mL/min   .   Radiology results:  Rad Results (ST)   US  Abdomen Limited  ?  03/11/19 09:49:44  Right upper quadrant abdominal ultrasound: 03/11/19    COMPARISON: Abdominal CT scan 06/10/2017    INDICATION:Abdominal pain, right upper quadrant.    TECHNIQUE: Multiple gray-scale and color Doppler images of the right upper  quadrant.    FINDINGS:  Given images of the liver are demonstrating hepatic steatosis.SABRA No  intrahepatic  biliary dilatation. Common bile duct measures 3.7 mm. Pancreatic head is not  demonstrated well by the technologist due to bowel gas.  No cholelithiasis or gallbladder sludge. No sonographic Murphys sign,  pericholecystic fluid, or gallbladder wall thickening.  Given images of the right kidney demonstrate a 9.9 cm length, no hydronephrosis.  No right upper quadrant free fluid.    IMPRESSION:  Hepatic steatosis. The gallbladder appears within normal limits. The pancreas is  not well seen.  ?  Signed By: ELLIOTT AGENT D  .      Impression and Plan   Diagnosis   Elevated bilirubin (ICD10-CM R17, Discharge, Medical)   Elevated LFTs (ICD10-CM  R79.89, Discharge, Medical)   Dehydration (ICD10-CM E86.0, Discharge, Medical)   Hepatic steatosis (ICD10-CM K76.0, Discharge, Medical)   Plan   Condition: Stable.    Disposition: Discharged: Time  03/11/2019 10:15:00, to home.    Prescriptions: Launch Meds List (Selected)   Prescriptions  Prescribed  Zofran ODT 4 mg oral tablet, disintegrating: 4 mg, 1 tabs, Oral, TID, 10 tabs, 0 Refill(s).    Patient was given the following educational materials: Dehydration, Adult.    Follow up with: [facility] Within 1 week.    Counseled: Patient, Regarding diagnosis, Regarding diagnostic results, Regarding treatment plan, Regarding prescription, Patient indicated understanding of instructions.

## 2019-03-11 NOTE — ED Notes (Signed)
 ED Patient Summary       ;       [facility] Emergency Department  [address] [place], [place] [address]  [phone]  Discharge Instructions (Patient)  _______________________________________     Name:Allison Patton, Allison Patton  DOB:  1961/01/07                   MRN: [id number]                   FIN: [id number]  Reason For Visit: Abdominal pain; Vomiting; VOMITING,L SIDE ABDOM PAIN  Final Diagnosis: Dehydration; Elevated LFTs; Elevated bilirubin; Hepatic steatosis     Visit Date: 03/11/2019 08:10:00  Address: [address] [place] [address]  Phone: [phone]     Emergency Department Providers:         Primary Physician:   MARINE ELSIE DASEN         [facility] would like to thank you for allowing us  to assist you with your healthcare needs. The following includes patient education materials and information regarding your injury/illness.     Follow-up Instructions:  You were seen today on an emergency basis. Please contact your primary care doctor for a follow up appointment. If you received a referral to a specialist doctor, it is important you follow-up as instructed.    It is important that you call your follow-up doctor to schedule and confirm the location of your next appointment. Your doctor may practice at multiple locations. The office location of your follow-up appointment may be different to the one written on your discharge instructions.    If you do not have a primary care doctor, please call [phone] for help in finding a Florie Cassis. [facility] Provider. For help in finding a specialist doctor, please call [phone].    The [facility] "Ask a Nurse" line in staffed by Registered Nurses and is a free service to the community. We are available Monday - Friday from 8am to 5pm to answer your questions about your health. Please call [phone].    If your condition gets worse before your follow-up with your primary care doctor  or specialist, please return to the Emergency Department.        Follow Up Appointments:  Primary Care Provider:      Name: AZUCENA ELSPETH GREET      Phone: [phone]                 With: Address: When:   [facility] Call for appt & office location   [phone] Business (1) Within 1 week              Printed Prescriptions:    Patient Education Materials:  Discharge Orders          Discharge Patient 03/11/19 10:09:00 EST         Comment:      Dehydration, Adult     Dehydration, Adult    Dehydration is a condition in which you do not have enough fluid or water in your body. It happens when you take in less fluid than you lose. Vital organs such as the kidneys, brain, and heart cannot function without a proper amount of fluids. Any loss of fluids from the body can cause dehydration.     Dehydration can range from mild to severe. This condition should be treated right away to help prevent it from  becoming severe.      CAUSES    This condition may be caused by:     Vomiting.     Diarrhea.     Excessive sweating, such as when exercising in hot or humid weather.     Not drinking enough fluid during strenuous exercise or during an illness.     Excessive urine output.     Fever.      Certain medicines.    RISK FACTORS    This condition is more likely to develop in:     People who are taking certain medicines that cause the body to lose excess fluid (diuretics). ?     People who have a chronic illness, such as diabetes, that may increase urination.      Older adults. ?     People who live at high altitudes. ?     People who participate in endurance sports. ?    SYMPTOMS    Mild Dehydration     Thirst.     Dry lips.     Slightly dry mouth.     Dry, warm skin.     Moderate Dehydration     Very dry mouth. ?     Muscle cramps. ?     Dark urine and decreased urine production. ?     Decreased tear production. ?     Headache. ?     Light-headedness, especially when you stand up from a  sitting position. ?    Severe Dehydration     Changes in skin. ?    ? Cold and clammy skin. ?    ? Skin does not spring back quickly when lightly pinched and released. ?     Changes in body fluids. ?    ? Extreme thirst. ?    ? No tears. ?    ? Not able to sweat when body temperature is high, such as in hot weather. ?    ? Minimal urine production. ?     Changes in vital signs. ?    ? Rapid, weak pulse (more than 100 beats per minute when you are sitting still). ?    ? Rapid breathing. ?    ? Low blood pressure. ?     Other changes. ?    ? Sunken eyes. ?    ? Cold hands and feet. ?    ? Confusion.     ? Lethargy and difficulty being awakened.     ? Fainting (syncope). ?    ? Short-term weight loss. ?    ? Unconsciousness.     DIAGNOSIS    This condition may be diagnosed based on your symptoms. You may also have tests to determine how severe your dehydration is. These tests may include:      Urine tests. ?     Blood tests. ?    TREATMENT    Treatment for this condition depends on the severity. Mild or moderate dehydration can often be treated at home. Treatment should be started right away. Do not wait until dehydration becomes severe. Severe dehydration needs to be treated at the hospital.    Treatment for Mild Dehydration     Drinking plenty of water to replace the fluid you have lost. ?     Replacing minerals in your blood (electrolytes) that you may have lost. ?    Treatment for Moderate Dehydration?     Consuming oral rehydration solution (ORS).     Treatment  for Severe Dehydration     Receiving fluid through an IV tube. ?     Receiving electrolyte solution through a feeding tube that is passed through your nose and into your stomach (nasogastric tube or NG tube).     Correcting any abnormalities in electrolytes.     HOME CARE INSTRUCTIONS     Drink enough fluid to keep your urine clear or pale yellow. ?     Drink water or fluid slowly by taking small sips. You can also try sucking on ice cubes.?     Have food  or beverages that contain electrolytes. Examples include bananas and sports drinks.     Take over-the-counter and prescription medicines only as told by your health care provider. ?     Prepare ORS according to the manufacturer's instructions. Take sips of ORS every 5 minutes until your urine returns to normal.     If you have vomiting or diarrhea, continue to try to drink water, ORS, or both. ?     If you have diarrhea, avoid: ?    ? Beverages that contain caffeine. ?    ? Fruit juice. ?    ? Milk. ??    ? Carbonated soft drinks.     Do not take salt tablets. This can lead to the condition of having too much sodium in your body (hypernatremia). ?    SEEK MEDICAL CARE IF:     You cannot eat or drink without vomiting.      You have had moderate diarrhea during a period of more than 24 hours.      You have a fever.    SEEK IMMEDIATE MEDICAL CARE IF:     You have extreme thirst.     You have severe diarrhea.      You have not urinated in 6?8 hours, or you have urinated only a small amount of very dark urine.     You have shriveled skin.     You are dizzy, confused, or both.    This information is not intended to replace advice given to you by your health care provider. Make sure you discuss any questions you have with your health care provider.    Document Released: 04/03/2005 Document Revised: 12/23/2014 Document Reviewed: 08/19/2014  Elsevier Interactive Patient Education ?2016 Elsevier Inc.         Allergy Info: No Known Medication Allergies     Medication Information:  [facility] ED Physicians provided you with a complete list of medications post discharge, if you have been instructed to stop taking a medication please ensure you also follow up with this information to your Primary Care Physician.  Unless otherwise noted, patient will continue to take medications as prescribed prior to the Emergency Room visit.  Any specific questions regarding your chronic medications and dosages should be discussed  with your physician(s) and pharmacist.          ALPRAZolam (ALPRAZolam 1 mg oral tablet) 1 Tabs Oral (given by mouth) every day as needed for anxiety.  FLUoxetine (FLUoxetine 20 mg oral capsule) 1 Capsules Oral (given by mouth) every day., SOUND ALIKE / LOOK ALIKE - VERIFY DRUG  folic acid (folic acid 1 mg oral tablet) 1 Tabs Oral (given by mouth) every day for 30 Days. Refills: 0.  levothyroxine (levothyroxine 88 mcg (0.088 mg) oral capsule) 1 Capsules Oral (given by mouth) every day.  lisinopril (lisinopril 10 mg oral tablet) 1 Tabs Oral (given by mouth) every  day.  melatonin Once a Day (at bedtime).  multivitamin with minerals (Multiple Vitamins with Minerals oral capsule) Oral (given by mouth) every day.  ondansetron (Zofran ODT 4 mg oral tablet, disintegrating) 1 Tabs Oral (given by mouth) 3 times a day. Refills: 0.  thiamine (thiamine 100 mg oral tablet) 1 Tabs Oral (given by mouth) every day.  triamterene-hydrochlorothiazide (triamterene-hydrochlorothiazide 37.5 mg-25 mg oral capsule) 1 Capsules Oral (given by mouth) every day.      Medications Administered During Visit:              Medication Dose Route   Sodium Chloride 0.9% 1000 mL IV Piggyback   ondansetron 4 mg IV Push   ketorolac 15 mg IV Push   Sodium Chloride 0.9% 1000 mL IV Piggyback          Major Tests and Procedures:  The following procedures and tests were performed during your ED visit.  COMMON PROCEDURES%>  COMMON PROCEDURES COMMENTS%>          Laboratory Orders  Name Status Details   CBCDIFF Completed Blood, Stat, ST - Stat, 03/11/19 8:33:00 EST, 03/11/19 8:34:00 EST, Nurse collect, RIVERS-MD,  ELSIE T, Print label Y/N   CMP Completed Blood, Stat, ST - Stat, 03/11/19 8:33:00 EST, 03/11/19 8:34:00 EST, Nurse collect, RIVERS-MD,  ELSIE T, Print label Y/N   Lipase Lvl Completed Blood, Stat, ST - Stat, 03/11/19 8:33:00 EST, 03/11/19 8:34:00 EST, Nurse collect, RIVERS-MD,  ELSIE T, Print label Y/N   UA Rflx Micro Completed Urine, Clean  Catch, Stat, ST - Stat, 03/11/19 8:34:00 EST, Once, 03/11/19 8:34:00 EST, Nurse collect               Radiology Orders  Name Status Details   US  Abdomen Limited Completed 03/11/19 8:33:00 EST, STAT 1 hour or less, Reason: Abdominal pain, right upper quadrant, Transport Mode: STRETCHER, pp_set_radiology_subspecialty               Patient Care Orders  Name Status Details   Discharge Patient Ordered 03/11/19 10:09:00 EST   ED Assessment Adult Completed 03/11/19 8:28:02 EST, 03/11/19 8:28:02 EST   ED Secondary Triage Completed 03/11/19 8:28:02 EST, 03/11/19 8:28:02 EST   ED Triage Adult Completed 03/11/19 8:10:55 EST, 03/11/19 8:10:55 EST   NIBP/Pulse Ox Monitoring Completed 03/11/19 8:33:00 EST, This message can only be seen by Nursing, it is not visible to Pharmacy, Laboratory, or Radiology., 03/11/19 8:33:00 EST, 03/11/19 8:33:00 EST, Once   Saline Lock Insert Completed 03/11/19 8:33:00 EST, Once, 03/11/19 8:33:00 EST       ---------------------------------------------------------------------------------------------------------------------  [facility]) encourages you to self-enroll in the [facility] Patient Portal.  [facility] Patient Portal will allow you to manage your personal health information securely from your own electronic device now and in the future.  To begin your Patient Portal enrollment process, please visit [email or web address] Click on "Sign up now" under [facility].  If you find that you need additional assistance on the [facility] Patient Portal or need a copy of your medical records, please call the [facility] Medical Records Office at [phone].  Comment:

## 2019-03-11 NOTE — ED Notes (Signed)
ED Patient Education Note     Patient Education Materials Follows:  Nutrition     Dehydration, Adult    Dehydration is a condition in which you do not have enough fluid or water in your body. It happens when you take in less fluid than you lose. Vital organs such as the kidneys, brain, and heart cannot function without a proper amount of fluids. Any loss of fluids from the body can cause dehydration.     Dehydration can range from mild to severe. This condition should be treated right away to help prevent it from becoming severe.      CAUSES    This condition may be caused by:     Vomiting.     Diarrhea.     Excessive sweating, such as when exercising in hot or humid weather.     Not drinking enough fluid during strenuous exercise or during an illness.     Excessive urine output.     Fever.      Certain medicines.    RISK FACTORS    This condition is more likely to develop in:     People who are taking certain medicines that cause the body to lose excess fluid (diuretics). ?     People who have a chronic illness, such as diabetes, that may increase urination.      Older adults. ?     People who live at high altitudes. ?     People who participate in endurance sports. ?    SYMPTOMS    Mild Dehydration     Thirst.     Dry lips.     Slightly dry mouth.     Dry, warm skin.     Moderate Dehydration     Very dry mouth. ?     Muscle cramps. ?     Dark urine and decreased urine production. ?     Decreased tear production. ?     Headache. ?     Light-headedness, especially when you stand up from a sitting position. ?    Severe Dehydration     Changes in skin. ?    ? Cold and clammy skin. ?    ? Skin does not spring back quickly when lightly pinched and released. ?     Changes in body fluids. ?    ? Extreme thirst. ?    ? No tears. ?    ? Not able to sweat when body temperature is high, such as in hot weather. ?    ? Minimal urine production. ?     Changes in vital signs. ?    ? Rapid, weak pulse (more than 100 beats per  minute when you are sitting still). ?    ? Rapid breathing. ?    ? Low blood pressure. ?     Other changes. ?    ? Sunken eyes. ?    ? Cold hands and feet. ?    ? Confusion.     ? Lethargy and difficulty being awakened.     ? Fainting (syncope). ?    ? Short-term weight loss. ?    ? Unconsciousness.     DIAGNOSIS    This condition may be diagnosed based on your symptoms. You may also have tests to determine how severe your dehydration is. These tests may include:      Urine tests. ?     Blood tests. ?    TREATMENT      Treatment for this condition depends on the severity. Mild or moderate dehydration can often be treated at home. Treatment should be started right away. Do not wait until dehydration becomes severe. Severe dehydration needs to be treated at the hospital.    Treatment for Mild Dehydration     Drinking plenty of water to replace the fluid you have lost. ?     Replacing minerals in your blood (electrolytes) that you may have lost. ?    Treatment for Moderate Dehydration?     Consuming oral rehydration solution (ORS).     Treatment for Severe Dehydration     Receiving fluid through an IV tube. ?     Receiving electrolyte solution through a feeding tube that is passed through your nose and into your stomach (nasogastric tube or NG tube).     Correcting any abnormalities in electrolytes.     HOME CARE INSTRUCTIONS     Drink enough fluid to keep your urine clear or pale yellow. ?     Drink water or fluid slowly by taking small sips. You can also try sucking on ice cubes.?     Have food or beverages that contain electrolytes. Examples include bananas and sports drinks.     Take over-the-counter and prescription medicines only as told by your health care provider. ?     Prepare ORS according to the manufacturer's instructions. Take sips of ORS every 5 minutes until your urine returns to normal.     If you have vomiting or diarrhea, continue to try to drink water, ORS, or both. ?     If you have diarrhea, avoid:  ?    ? Beverages that contain caffeine. ?    ? Fruit juice. ?    ? Milk. ??    ? Carbonated soft drinks.     Do not take salt tablets. This can lead to the condition of having too much sodium in your body (hypernatremia). ?    SEEK MEDICAL CARE IF:     You cannot eat or drink without vomiting.      You have had moderate diarrhea during a period of more than 24 hours.      You have a fever.    SEEK IMMEDIATE MEDICAL CARE IF:     You have extreme thirst.     You have severe diarrhea.      You have not urinated in 6?8 hours, or you have urinated only a small amount of very dark urine.     You have shriveled skin.     You are dizzy, confused, or both.    This information is not intended to replace advice given to you by your health care provider. Make sure you discuss any questions you have with your health care provider.    Document Released: 04/03/2005 Document Revised: 12/23/2014 Document Reviewed: 08/19/2014  Elsevier Interactive Patient Education ?2016 Elsevier Inc.

## 2019-03-11 NOTE — ED Notes (Signed)
ED Triage Note       ED Secondary Triage Entered On:  03/11/2019 8:29 EST    Performed On:  03/11/2019 8:28 EST by Edwyna Perfect, RN, PAMELA M               General Information   Barriers to Learning :   None evident   Pt. Currently Receiving Radiation :   No   ED Home Meds Section :   Document assessment   [facility] ED Fall Risk Section :   Document assessment   ED History Section :   Document assessment   ED Advance Directives Section :   Document assessment   ED Palliative Screen :   N/A (prefilled for <65yo)   Edwyna Perfect, RN, PAMELA M - 03/11/2019 8:28 EST   (As Of: 03/11/2019 08:30:00 EST)   Problems(Active)    Depression (SNOMED CT  :10960454 )  Name of Problem:   Depression ; Recorder:   Edwyna Perfect, RN, PAMELA M; Confirmation:   Confirmed ; Classification:   Patient Stated ; Code:   [id number] ; Contributor System:   Dietitian ; Last Updated:   03/11/2019 8:29 EST ; Life Cycle Date:   03/11/2019 ; Life Cycle Status:   Active ; Vocabulary:   SNOMED CT        HTN (hypertension) (SNOMED CT  :7829562130 )  Name of Problem:   HTN (hypertension) ; Recorder:   RICHARDSON, RN, [employer]; Confirmation:   Confirmed ; Classification:   Patient Stated ; Code:   [id number] ; Contributor System:   Dietitian ; Last Updated:   06/10/2017 15:04 EST ; Life Cycle Date:   06/10/2017 ; Life Cycle Status:   Active ; Vocabulary:   SNOMED CT        Thyroid disease (SNOMED CT  :95284132 )  Name of Problem:   Thyroid disease ; Recorder:   RICHARDSON, RN, JENNIFER; Confirmation:   Confirmed ; Classification:   Patient Stated ; Code:   [id number] ; Contributor System:   Dietitian ; Last Updated:   06/10/2017 15:04 EST ; Life Cycle Date:   06/10/2017 ; Life Cycle Status:   Active ; Vocabulary:   SNOMED CT          Diagnoses(Active)    Abdominal pain  Date:   03/11/2019 ; Diagnosis Type:   Reason For Visit ; Confirmation:   Complaint of ; Clinical Dx:   Abdominal pain ; Classification:   Medical ; Clinical Service:   Emergency medicine ; Code:   PNED ;  Probability:   0 ; Diagnosis Code:   [id number]      Vomiting  Date:   03/11/2019 ; Diagnosis Type:   Reason For Visit ; Confirmation:   Complaint of ; Clinical Dx:   Vomiting ; Classification:   Medical ; Clinical Service:   Emergency medicine ; Code:   PNED ; Probability:   0 ; Diagnosis Code:   [id number]             -    Procedure History   (As Of: 03/11/2019 08:30:00 EST)     Anesthesia Minutes:   0 ; Procedure Name:   Hysterectomy ; Procedure Minutes:   0            Anesthesia Minutes:   0 ; Procedure Name:   Caesarean delivery - delivered ; Procedure Minutes:   0            [facility] Fall Risk Assessment Tool   Hx of falling  last 3 months ED Fall :   No   Patient confused or disoriented ED Fall :   No   Patient intoxicated or sedated ED Fall :   No   Patient impaired gait ED Fall :   No   Use a mobility assistance device ED Fall :   No   Patient altered elimination ED Fall :   No   [facility] ED Fall Score :   0    Edwyna Perfect, RN, PAMELA M - 03/11/2019 8:28 EST   ED Advance Directive   Advance Directive :   No   Edwyna Perfect, RN, PAMELA M - 03/11/2019 8:28 EST   Social History   Social History   (As Of: 03/11/2019 08:30:00 EST)   Tobacco:        Tobacco use: Former smoker, quit more than 30 days ago.   (Last Updated: 06/10/2017 15:06:54 EST by Senaida Ores, RN, JENNIFER)          Alcohol:        Current, Daily   (Last Updated: 06/10/2017 15:07:01 EST by Senaida Ores, RN, JENNIFER)            Med Hx   Medication List   (As Of: 03/11/2019 08:30:00 EST)   Prescription/Discharge Order    folic acid  :   folic acid ; Status:   Prescribed ; Ordered As Mnemonic:   folic acid 1 mg oral tablet ; Simple Display Line:   1 mg, 1 tabs, Oral, Daily, for 30 days, 30 tabs, 0 Refill(s) ; Ordering Provider:   Ala Bent; Catalog Code:   folic acid ; Order Dt/Tm:   11/29/2018 11:22:36 EDT            Home Meds    FLUoxetine  :   FLUoxetine ; Status:   Documented ; Ordered As  Mnemonic:   FLUoxetine 20 mg oral capsule ; Simple Display Line:   20 mg, 1 caps, Oral, Daily, 90 caps, 0 Refill(s) ; Catalog Code:   FLUoxetine ; Order Dt/Tm:   03/11/2019 08:29:10 EST ; Comment:   SOUND ALIKE / LOOK ALIKE - VERIFY DRUG          triamterene-hydrochlorothiazide  :   triamterene-hydrochlorothiazide ; Status:   Documented ; Ordered As Mnemonic:   triamterene-hydrochlorothiazide 37.5 mg-25 mg oral capsule ; Simple Display Line:   1 caps, Oral, Daily, 90 caps, 0 Refill(s) ; Catalog Code:   triamterene-hydrochlorothiazide ; Order Dt/Tm:   03/11/2019 08:29:10 EST          thiamine  :   thiamine ; Status:   Documented ; Ordered As Mnemonic:   thiamine 100 mg oral tablet ; Simple Display Line:   100 mg, 1 tabs, Oral, Daily, 0 Refill(s) ; Ordering Provider:   Ala Bent; Catalog Code:   thiamine ; Order Dt/Tm:   11/28/2018 09:36:14 EDT          multivitamin with minerals  :   multivitamin with minerals ; Status:   Documented ; Ordered As Mnemonic:   Multiple Vitamins with Minerals oral capsule ; Simple Display Line:   Oral, Daily, 0 Refill(s) ; Ordering Provider:   Ala Bent; Catalog Code:   multivitamin with minerals ; Order Dt/Tm:   11/28/2018 09:30:55 EDT          ALPRAZolam  :   ALPRAZolam ; Status:   Documented ; Ordered As Mnemonic:   ALPRAZolam 1 mg oral tablet ; Simple Display Line:  1 mg, 1 tabs, Oral, Daily, PRN: for anxiety, 0 Refill(s) ; Catalog Code:   ALPRAZolam ; Order Dt/Tm:   11/27/2018 18:07:01 EDT          lisinopril  :   lisinopril ; Status:   Documented ; Ordered As Mnemonic:   lisinopril 10 mg oral tablet ; Simple Display Line:   10 mg, 1 tabs, Oral, Daily, 0 Refill(s) ; Catalog Code:   lisinopril ; Order Dt/Tm:   06/10/2017 15:06:15 EST          melatonin  :   melatonin ; Status:   Documented ; Ordered As Mnemonic:   melatonin ; Simple Display Line:   Once a Day (at bedtime), 0 Refill(s) ; Catalog Code:   melatonin ; Order Dt/Tm:   06/10/2017 15:06:31 EST           levothyroxine  :   levothyroxine ; Status:   Documented ; Ordered As Mnemonic:   levothyroxine 88 mcg (0.088 mg) oral capsule ; Simple Display Line:   88 mcg, 1 caps, Oral, Daily, 30 caps, 0 Refill(s) ; Catalog Code:   levothyroxine ; Order Dt/Tm:   06/10/2017 15:05:53 EST

## 2019-03-11 NOTE — ED Notes (Signed)
ED Triage Note       ED Triage Adult Entered On:  03/11/2019 8:28 EST    Performed On:  03/11/2019 8:23 EST by Edwyna Perfect, RN, PAMELA M               Triage   Chief Complaint :   c/o VOMITING SINCE SUNDAY, RIGHT SIDE PAIN. GASSY STOMACH.  WAS ON PROZAC  FOR 2 WEEKS, STOPPED IT ON MONDAY. VOMITING HAS EASED. VERY ANXIOUS   Numeric Rating Pain Scale :   4   [place] Mode of Arrival :   Walking   Infectious Disease Documentation :   Document assessment   Temperature Oral :   37.2 degC(Converted to: 99.0 degF)    Heart Rate Monitored :   87 bpm   Respiratory Rate :   17 br/min   Systolic Blood Pressure :   158 mmHg (HI)    Diastolic Blood Pressure :   110 mmHg (>HHI)    SpO2 :   97 %   Oxygen Therapy :   Room air   Patient presentation :   None of the above   Chief Complaint or Presentation suggest infection :   No   Dosing Weight Obtained By :   Patient stated   Weight Dosing :   84 kg(Converted to: 185 lb 3 oz)    Height :   170 cm(Converted to: 5 ft 7 in)    Body Mass Index Dosing :   29 kg/m2   Edwyna Perfect, RN, PAMELA M - 03/11/2019 8:23 EST   DCP GENERIC CODE   Tracking Acuity :   3   Tracking Group :   ED [employer]   [place], RN, PAMELA M - 03/11/2019 8:23 EST   ED General Section :   Document assessment   Pregnancy Status :   Patient denies   ED Allergies Section :   Document assessment   ED Reason for Visit Section :   Document assessment   Edwyna Perfect, RN, PAMELA M - 03/11/2019 8:23 EST   ID Risk Screen Symptoms   Recent Travel History :   No recent travel   Close Contact with COVID-19 ID :   No   Last 14 days COVID-19 ID :   No   TB Symptom Screen :   No symptoms   C. diff Symptom/History ID :   Neither of the above   WENGERT, RN, PAMELA M - 03/11/2019 8:23 EST   Allergies   (As Of: 03/11/2019 08:28:01 EST)   Allergies (Active)   No Known Medication Allergies  Estimated Onset Date:   Unspecified ; Created By:   Senaida Ores RN, Victorino Dike; Reaction Status:   Active ; Category:   Drug ; Substance:   No Known  Medication Allergies ; Type:   Allergy ; Updated By:   Senaida Ores RN, JENNIFER; Reviewed Date:   03/11/2019 8:27 EST        Psycho-Social   Last 3 mo, thoughts killing self/others :   Patient denies   Edwyna Perfect RN, Andee Poles - 03/11/2019 8:23 EST   ED Reason for Visit   (As Of: 03/11/2019 08:28:01 EST)   Problems(Active)    HTN (hypertension) (SNOMED CT  :2248250037 )  Name of Problem:   HTN (hypertension) ; Recorder:   RICHARDSON, RN, [employer]; Confirmation:   Confirmed ; Classification:   Patient Stated ; Code:   [id number] ; Contributor System:   Dietitian ; Last Updated:   06/10/2017 15:04 EST ;  Life Cycle Date:   06/10/2017 ; Life Cycle Status:   Active ; Vocabulary:   SNOMED CT        Thyroid disease (SNOMED CT  :93131472 )  Name of Problem:   Thyroid disease ; Recorder:   RICHARDSON, RN, JENNIFER; Confirmation:   Confirmed ; Classification:   Patient Stated ; Code:   [id number] ; Contributor System:   Dietitian ; Last Updated:   06/10/2017 15:04 EST ; Life Cycle Date:   06/10/2017 ; Life Cycle Status:   Active ; Vocabulary:   SNOMED CT          Diagnoses(Active)    Abdominal pain  Date:   03/11/2019 ; Diagnosis Type:   Reason For Visit ; Confirmation:   Complaint of ; Clinical Dx:   Abdominal pain ; Classification:   Medical ; Clinical Service:   Emergency medicine ; Code:   PNED ; Probability:   0 ; Diagnosis Code:   [id number]      Vomiting  Date:   03/11/2019 ; Diagnosis Type:   Reason For Visit ; Confirmation:   Complaint of ; Clinical Dx:   Vomiting ; Classification:   Medical ; Clinical Service:   Emergency medicine ; Code:   PNED ; Probability:   0 ; Diagnosis Code:   [id number]

## 2019-03-11 NOTE — ED Provider Notes (Signed)
Abdominal Pain *ED        Patient:   Allison Patton, Allison Patton             MRN: [id number]            FIN: [id number]               Age:   58 years     Sex:  Female     DOB:  February 15, 1961   Associated Diagnoses:   Elevated bilirubin; Elevated LFTs; Dehydration; Hepatic steatosis   Author:   Virgilio Belling T      Basic Information   Time seen: Provider Seen (ST)   ED Provider/Time:    Virgilio Belling T / 03/11/2019 08:20  .   Additional information: Chief Complaint from Nursing Triage Note   Chief Complaint  Chief Complaint: c/o VOMITING SINCE SUNDAY, RIGHT SIDE PAIN. GASSY STOMACH.  WAS ON PROZAC  FOR 2 WEEKS, STOPPED IT ON MONDAY. VOMITING HAS EASED. VERY ANXIOUS (03/11/19 08:23:00).      History of Present Illness   The patient presents with Is a 58 year old female with history of anxiety.  She is presenting here for vomiting, diarrhea, and abdominal pain.  The symptoms began about 3 days ago.  She states she has had multiple episodes of vomiting and feels quite gassy.  She states the pain is kind of all over, but when pressed she states it is mostly in the right upper quadrant.  She has had no fevers or chills.  There are no chest pain or shortness of breath.  There is no back pain.  She has no dysuria or hematuria.  No bloody stools..        Review of Systems   Constitutional symptoms:  No fever, no chills.    Skin symptoms:  No rash, no lesion.    Eye symptoms:  Vision unchanged, No blurred vision,    ENMT symptoms:  No sore throat, no sinus pain.    Respiratory symptoms:  No shortness of breath, no cough.    Cardiovascular symptoms:  No chest pain, no palpitations, no syncope, no diaphoresis, no peripheral edema.    Gastrointestinal symptoms:  Abdominal pain, nausea, vomiting, diarrhea, No constipation,    Genitourinary symptoms:  No dysuria, no hematuria.    Musculoskeletal symptoms:  No back pain, no Muscle pain.    Neurologic symptoms:  No headache, no dizziness.    Allergy/immunologic symptoms:  No recurrent  infections, no impaired immunity.       Health Status   Allergies:    Allergic Reactions (Selected)  No Known Medication Allergies.   Medications:  (Selected)   Prescriptions  Prescribed  folic acid 1 mg oral tablet: 1 mg, 1 tabs, Oral, Daily, for 30 days, 30 tabs, 0 Refill(s)  Documented Medications  Documented  ALPRAZolam 1 mg oral tablet: 1 mg, 1 tabs, Oral, Daily, PRN: for anxiety, 0 Refill(s)  FLUoxetine 20 mg oral capsule: 20 mg, 1 caps, Oral, Daily, 90 caps, 0 Refill(s)  Multiple Vitamins with Minerals oral capsule: Oral, Daily, 0 Refill(s)  levothyroxine 88 mcg (0.088 mg) oral capsule: 88 mcg, 1 caps, Oral, Daily, 30 caps, 0 Refill(s)  lisinopril 10 mg oral tablet: 10 mg, 1 tabs, Oral, Daily, 0 Refill(s)  melatonin: Once a Day (at bedtime), 0 Refill(s)  thiamine 100 mg oral tablet: 100 mg, 1 tabs, Oral, Daily, 0 Refill(s)  triamterene-hydrochlorothiazide 37.5 mg-25 mg oral capsule: 1 caps, Oral, Daily, 90 caps, 0 Refill(s).  Past Medical/ Family/ Social History   Surgical history: Reviewed as documented in chart.   Family history: Reviewed as documented in chart.   Social history: Reviewed as documented in chart.   Problem list:    Active Problems (3)  Depression   HTN (hypertension)   Thyroid disease   .      Physical Examination               Vital Signs   Vital Signs   96/75/9163 8:46 EST Systolic Blood Pressure 659 mmHg  HI    Diastolic Blood Pressure 935 mmHg  >HHI    Peripheral Pulse Rate 86 bpm    Heart Rate Monitored 85 bpm    Mean Arterial Pressure, Cuff 135 mmHg  HI    SpO2 97 %   70/17/7939 0:30 EST Systolic Blood Pressure 092 mmHg  HI    Diastolic Blood Pressure 330 mmHg  >HHI    Temperature Oral 37.2 degC    Heart Rate Monitored 87 bpm    Respiratory Rate 17 br/min    SpO2 97 %   .   Measurements   03/11/2019 8:28 EST Body Mass Index est meas 29.07 kg/m2    Body Mass Index Measured 29.07 kg/m2   03/11/2019 8:23 EST Height/Length Measured 170 cm    Weight Dosing 84 kg   .   General:  Alert,  no acute distress.    Skin:  Warm, dry, intact.    Head:  Normocephalic, atraumatic.    Eye:  Extraocular movements are intact, normal conjunctiva.    Ears, nose, mouth and throat:  Oral mucosa moist.   Cardiovascular:  Normal peripheral perfusion, No edema.    Respiratory:  Respirations are non-labored, Symmetrical chest wall expansion.    Gastrointestinal:  Soft, Non distended, Tenderness: Mild, right upper quadrant.    Neurological:  No focal neurological deficit observed, normal motor observed, normal speech observed.    Psychiatric:  Cooperative, appropriate mood & affect.       Medical Decision Making   Rationale:  58 year old female presents here for nausea, vomiting, diarrhea and some generalized abdominal pain that is mostly in the right upper quadrant.  Will get right upper quadrant ultrasound on her.  Will get basic labs as well as urinalysis.  She is extremely anxious, but nontoxic-appearing.Marland Kitchen   Results review:  Lab results : Lab View   03/11/2019 9:47 EST UA Color Yellow    UA Appear Clear    UA Glucose Negative    UA Bili Moderate    UA Ketones Trace    UA Spec Grav 1.020    UA Blood Negative    UA pH 7.5    Protein U Trace    UA Urobilinogen 2.0 EU/dL    UA Nitrite Negative    UA Leuk Est Trace    WBC U 3-5 /HPF    RBC U Not Seen /HPF    Sq Epi U Few /LPF   03/11/2019 9:11 EST Estimated Creatinine Clearance 99.82 mL/min   03/11/2019 8:44 EST WBC 8.5 x10e3/mcL    RBC 4.56 x10e6/mcL    Hgb 14.9 g/dL    HCT 42.6 %    MCV 93.4 fL    MCH 32.7 pg    MCHC 35.0 g/dL    RDW 11.2 %    Platelet 205 x10e3/mcL    MPV 9.9 fL    Neutro Auto 63.3 %    Neutro Absolute 5.4 x10e3/mcL    Immature Grans  Percent 0.4 %    Immature Grans Absolute 0.03 x10e3/mcL    Lymph Auto 18.4 %    Lymph Absolute 1.6 x10e3/mcL    Mono Auto 15.1 %  HI    Mono Absolute 1.3 x10e3/mcL  HI    Eosinophil Percent 2.2 %    Eos Absolute 0.2 x10e3/mcL    Basophil Auto 0.6 %    Baso Absolute 0.1 x10e3/mcL    Sodium Lvl 129 mmol/L  LOW    Potassium  Lvl 3.4 mmol/L  LOW    Chloride 86 mmol/L  LOW    CO2 25 mmol/L    Glucose Random 113 mg/dL  HI    BUN 14 mg/dL    Creatinine Lvl 0.7 mg/dL    AGAP 18 mmol/L  HI    Osmolality Calc 260 mOsm/kg  LOW    Calcium Lvl 9.8 mg/dL    Protein Total 8.0 g/dL    Albumin Lvl 4.6 g/dL    Globulin Calc 3.0 g/dL    AG Ratio Calc 1.00 mmol/L    Alk Phos 78 unit/L    AST 64 unit/L  HI    ALT 50 unit/L  HI    eGFR AA 112 mL/min/1.37m???    eGFR Non-AA 97 mL/min/1.756m??    Bili Total 2.46 mg/dL  HI    Lipase Lvl 58 unit/L   03/11/2019 8:28 EST Estimated Creatinine Clearance 139.75 mL/min   .   Radiology results:  Rad Results (ST)   USKoreabdomen Limited  ?  03/11/19 09:49:44  Right upper quadrant abdominal ultrasound: 03/11/19    COMPARISON: Abdominal CT scan 06/10/2017    INDICATION:Abdominal pain, right upper quadrant.    TECHNIQUE: Multiple gray-scale and color Doppler images of the right upper  quadrant.    FINDINGS:  Given images of the liver are demonstrating hepatic steatosis.. No intrahepatic  biliary dilatation. Common bile duct measures 3.7 mm. Pancreatic head is not  demonstrated well by the technologist due to bowel gas.  No cholelithiasis or gallbladder sludge. No sonographic Murphys sign,  pericholecystic fluid, or gallbladder wall thickening.  Given images of the right kidney demonstrate a 9.9 cm length, no hydronephrosis.  No right upper quadrant free fluid.    IMPRESSION:  Hepatic steatosis. The gallbladder appears within normal limits. The pancreas is  not well seen.  ?  Signed By: WEMurray Hodgkins  .      Impression and Plan   Diagnosis   Elevated bilirubin (ICD10-CM R17, Discharge, Medical)   Elevated LFTs (ICD10-CM R79.89, Discharge, Medical)   Dehydration (ICD10-CM E86.0, Discharge, Medical)   Hepatic steatosis (ICD10-CM K76.0, Discharge, Medical)   Plan   Condition: Stable.    Disposition: Discharged: Time  03/11/2019 10:15:00, to home.    Prescriptions: Launch Meds List (Selected)    Prescriptions  Prescribed  Zofran ODT 4 mg oral tablet, disintegrating: 4 mg, 1 tabs, Oral, TID, 10 tabs, 0 Refill(s).    Patient was given the following educational materials: Dehydration, Adult.    [facility]    Counseled: Patient, Regarding diagnosis, Regarding diagnostic results, Regarding treatment plan, Regarding prescription, Patient indicated understanding of instructions.    Signature Line     Electronically Signed on 03/11/2019 10:15 AM EST   ________________________________________________   RIVirgilio Belling               Modified by: RIVirgilio Belling on 03/11/2019 08:43 AM EST  Modified by: Virgilio Belling T on 03/11/2019 08:44 AM EST      Modified by: Virgilio Belling T on 03/11/2019 10:15 AM EST

## 2020-10-04 IMAGING — MG MAMMO SCRN BIL W/CAD TOMO
8 series · 8 of 24 positions shown · non-contrast
Comparison: The present examination has been compared to prior imaging studies.

Images Obtained from [facility]
INDICATION: Screening.
TECHNIQUE: Bilateral 2-D digital screening mammogram was performed followed by 3-D tomosynthesis.  Current study was also evaluated with a computer aided detection (CAD) system.
MAMMOGRAM FINDINGS:
There are scattered areas of fibroglandular density.
No suspicious abnormality is seen in either breast.  There are no significant changes from the prior study.

[R MLO]
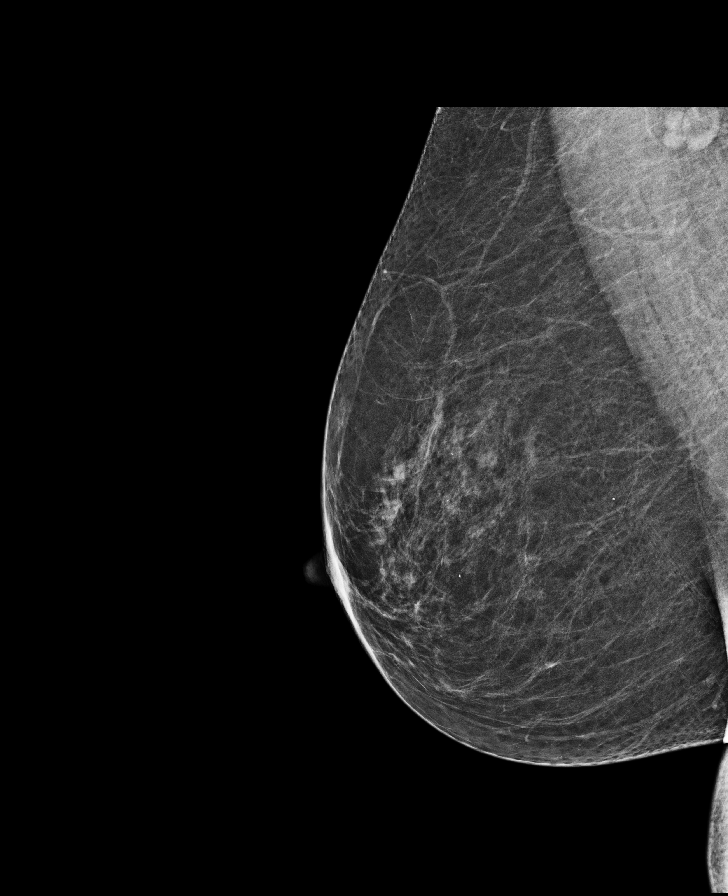

[L CC]
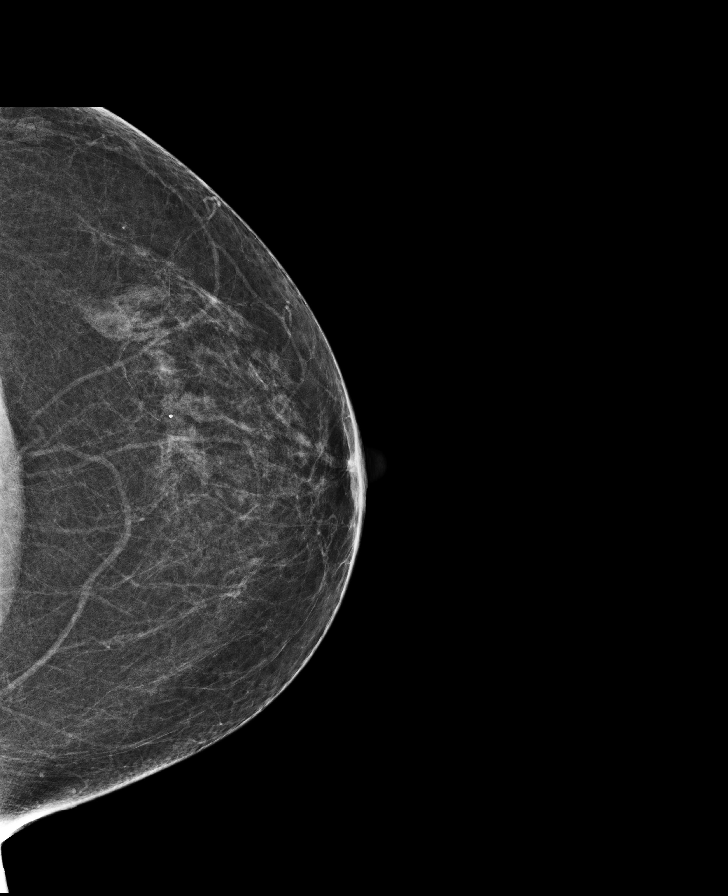

[L MLO]
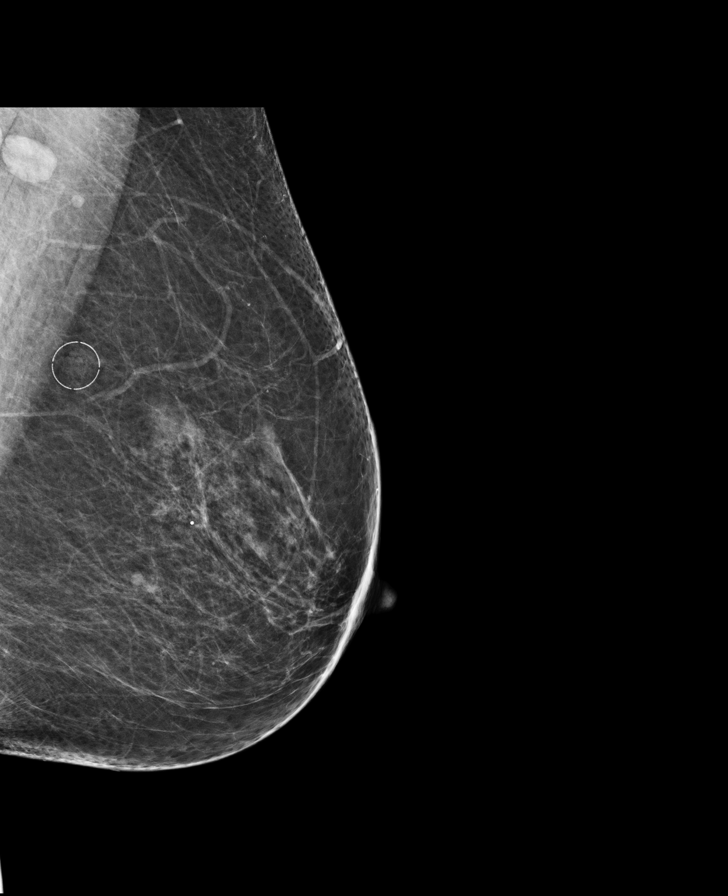

[R CC]
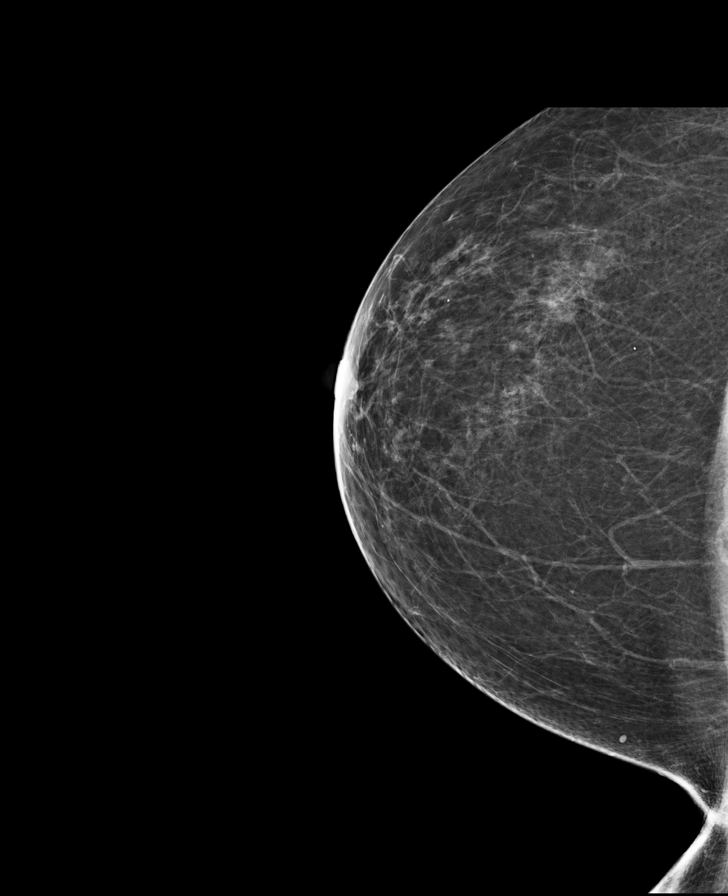

[L CC tomo · tomo slice 34/67.0]
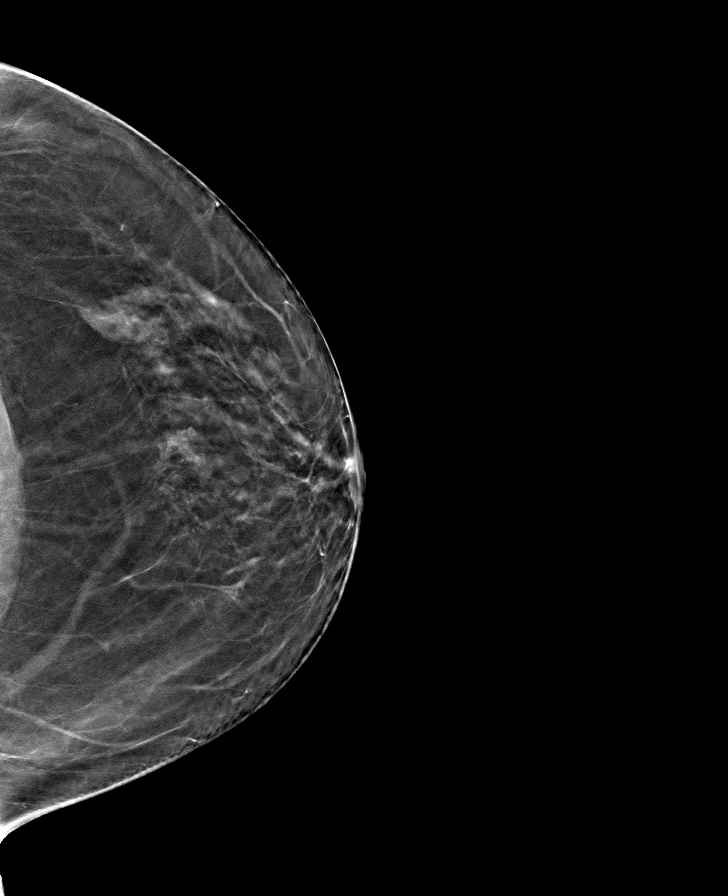

[R MLO tomo · tomo slice 34/67.0]
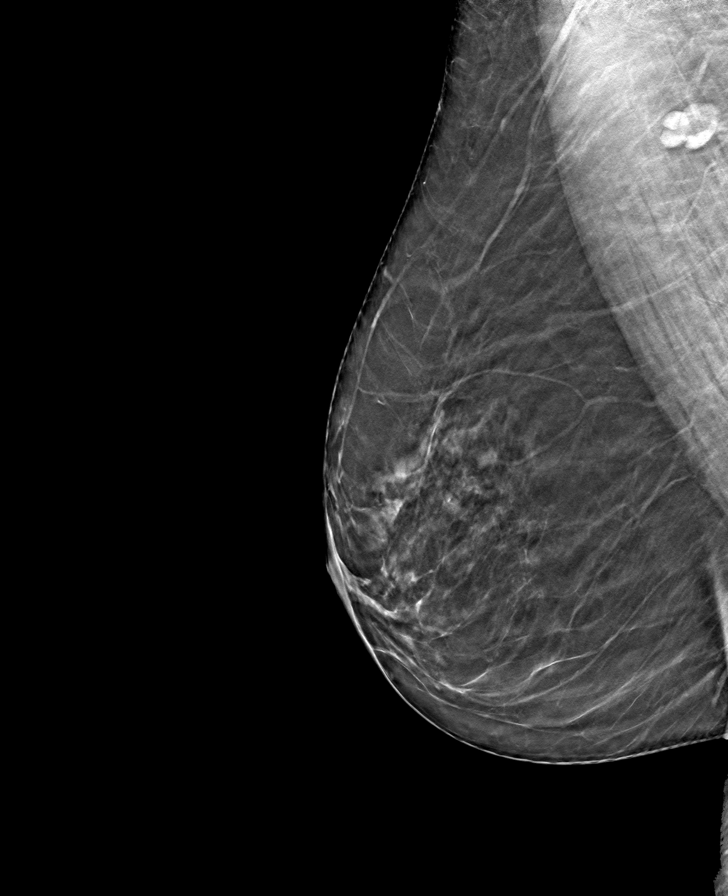

[R CC tomo · tomo slice 31/61.0]
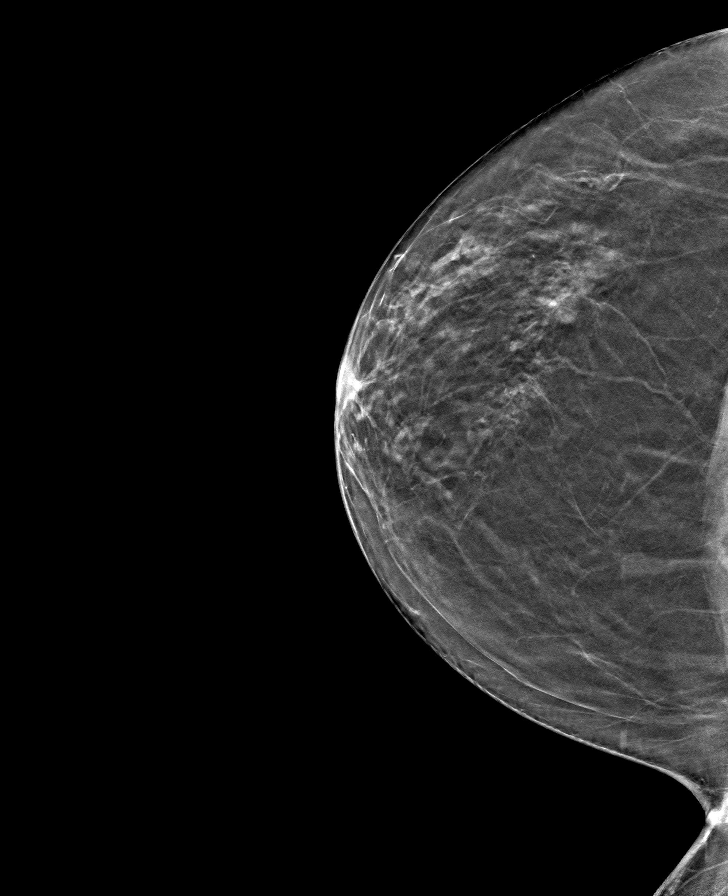

[L MLO tomo · tomo slice 33/66.0]
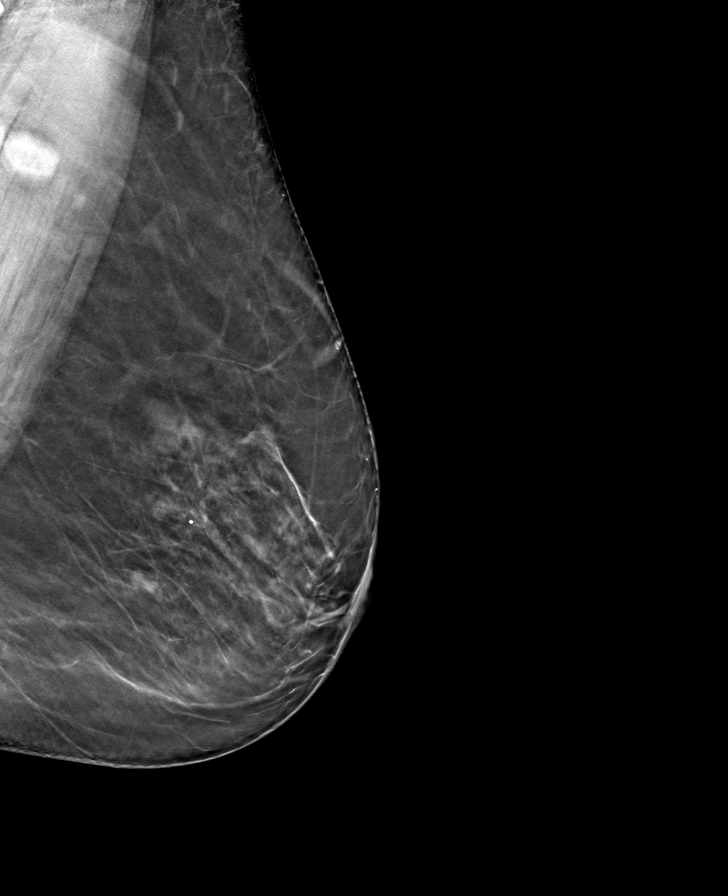

[8 of 24 positions shown; findings below may reference images not displayed]

IMPRESSION: There is no mammographic evidence of malignancy.
Screening mammogram recommended in 1 year.
BI-RADS Category 1: Negative

## 2021-01-26 IMAGING — CT CT LOW DOSE LUNG SCREENING
2 of 5 series · 12 of 36 positions shown, 15 images · non-contrast
Comparison: None.

HISTORY: 60 year-old Female. Former smoker, quit 9579, 1 pack per day for 30 years.
TECHNIQUE: Scans of the chest are performed in the transaxial plane, reconstructed at 2 mm increments, without IV contrast using a low-dose scanning protocol. Coronal and sagittal lung reconstructions are also obtained.            

Dose reduction technique used: Automated exposure control and adjustment of the mA and/or kV according to patient size. CT count in previous 12 months: 0. 
Total radiation dose to patient is CTDIvol 0.64 mGy and DLP 25.60 mGy-cm.

[Series 4: lung · axial · 0.57mm/px · z∈[-1240,-940]mm · 9 of 179 slices shown, 12 images]
[im 19/179  mediastinal]
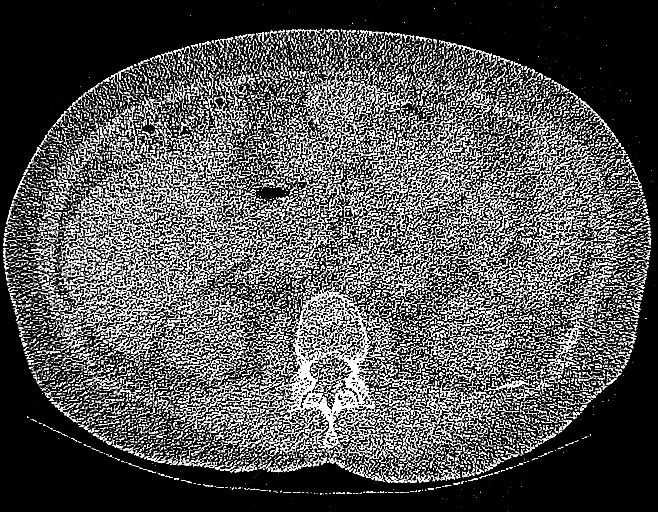
[im 19/179  lung]
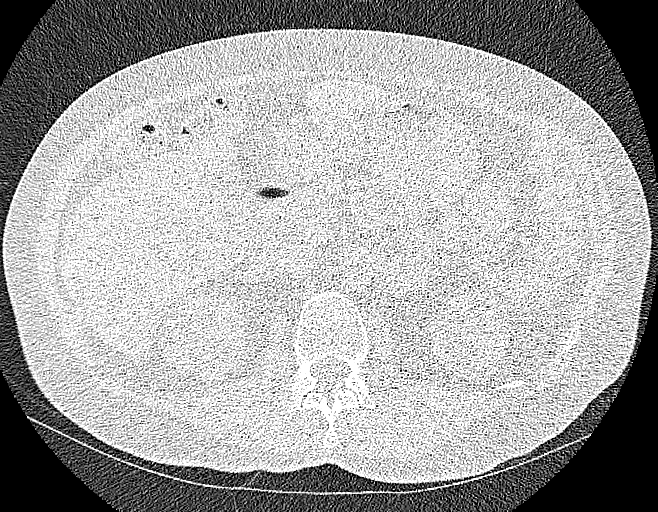
[im 38/179  lung]
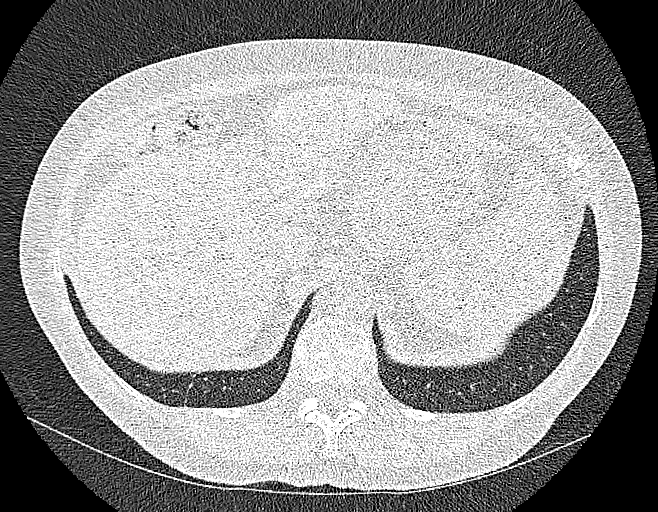
[im 57/179  lung]
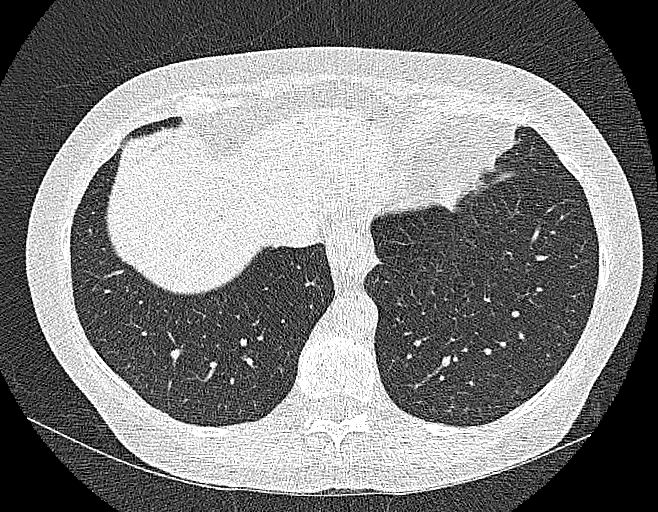
[im 75/179  lung]
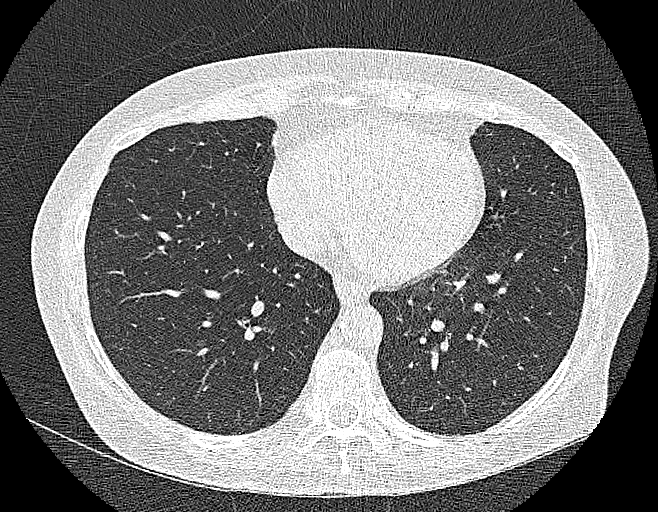
[im 94/179  mediastinal]
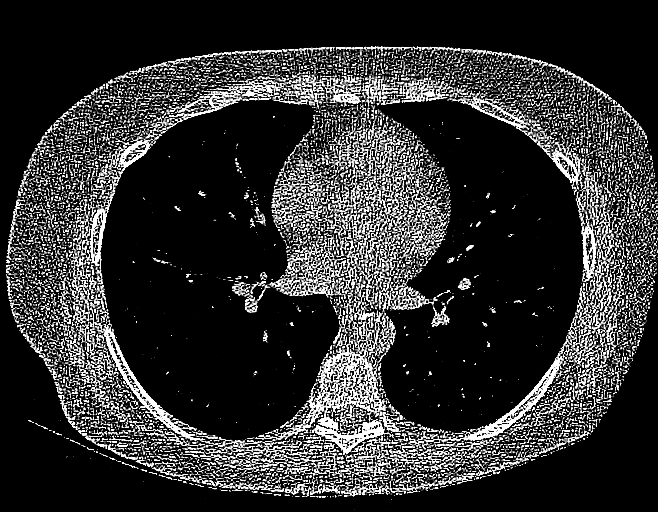
[im 94/179  lung]
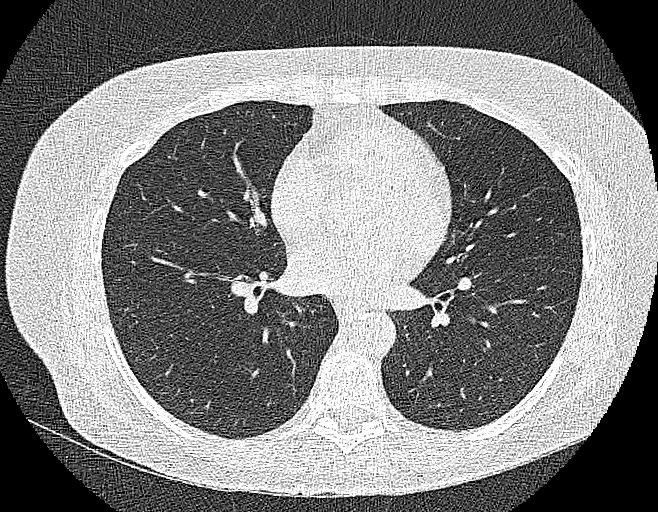
[im 113/179  lung]
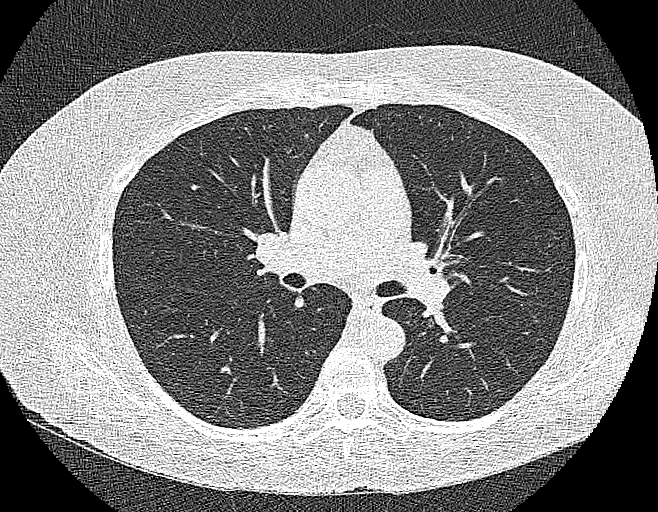
[im 132/179  lung]
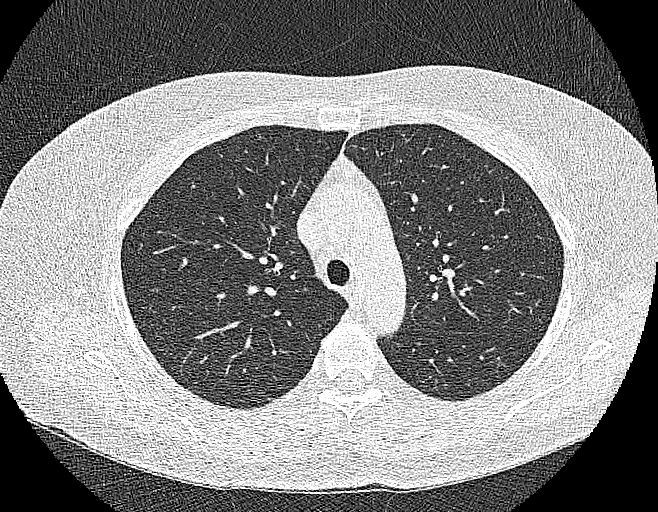
[im 150/179  lung]
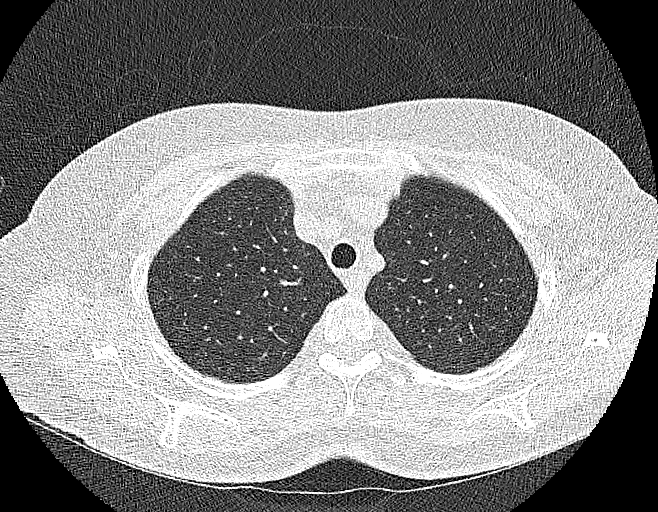
[im 169/179  mediastinal]
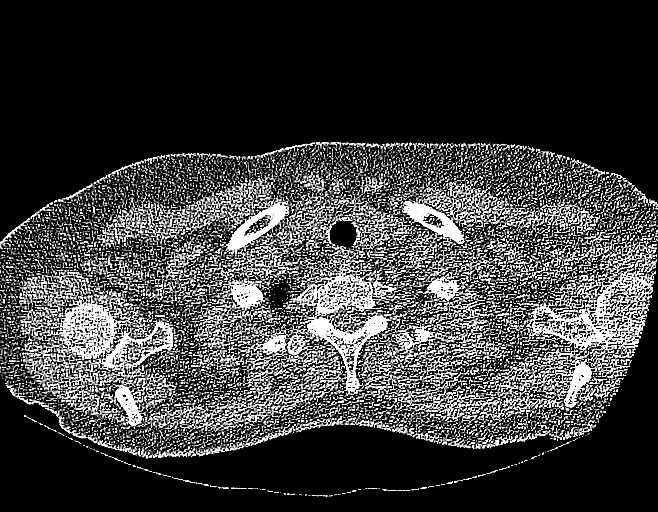
[im 169/179  lung]
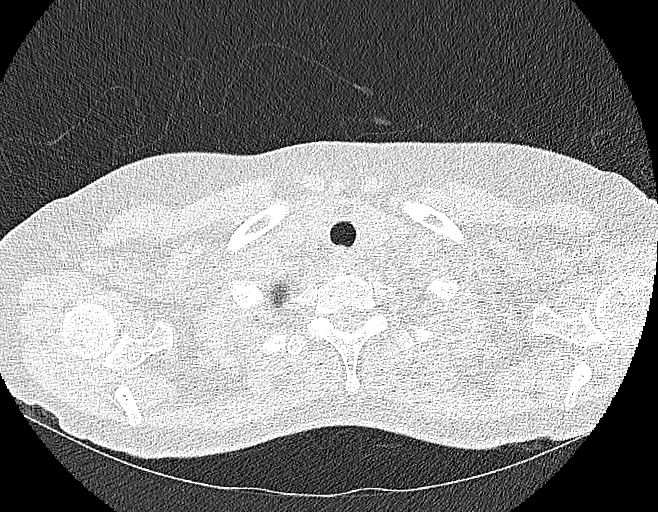

[Series 6: coronal · coronal · 0.70mm/px · 3 of 116 slices shown]
[im 24/116  lung]
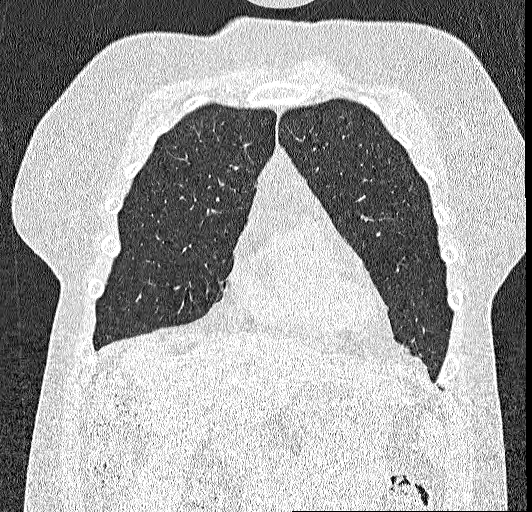
[im 47/116  lung]
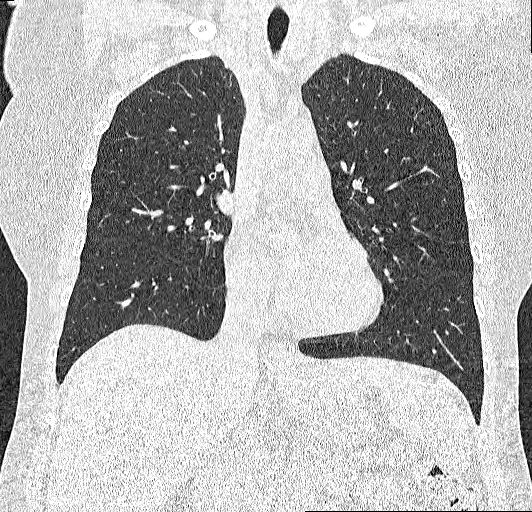
[im 70/116  lung]
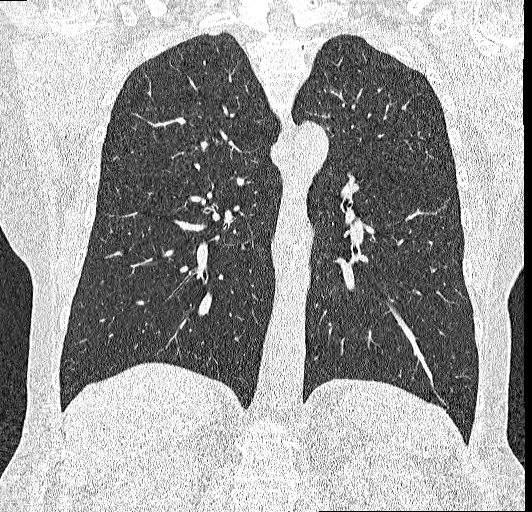

[12 of 36 positions shown; findings below may reference images not displayed]

FINDINGS: Lungs and airways: The airways are patent. There is no evidence of bronchiectasis or bronchial wall thickening. The lungs are clear.

Pleura: No pleural effusion.

Thoracic inlet: The thoracic inlet is unremarkable.

Mediastinum and Hilum: There is no significant mediastinal or hilar lymphadenopathy.

Heart and great vessels: Mild coronary arterial calcifications are present.

Soft tissues: Normal.

Abdomen: Limited noncontrast views of the upper abdomen show no abnormality within the visualized liver, gallbladder, spleen or kidneys. The adrenal glands are normal.

Bones: Moderate to severe degenerative disc changes identified in the thoracic spine.
IMPRESSION: 1.
No CT evidence of bronchiectasis or bronchial wall thickening.

2.
The lungs are clear.

Lung Rads Category: 1

CATEGORY: 1.

Negative study, no nodules and/or definite benign pulmonary nodules. The probability of malignancy is less than 1%.

Recommendation: Continue annual screening with low dose CT in 12 months.

## 2021-01-26 IMAGING — MR MRI KNEE LT WO CONTRAST
5 series · 40 of 40 positions shown · non-contrast
Comparison: None.

INDICATION: Pain in left knee.
TECHNIQUE: Multiplanar, multiecho imaging of the left knee was performed, including T1-weighted and fluid sensitive sequences without intravenous contrast administration.

[Series 2: t2_axial_fs · axial · 4.0mm · 0.57mm/px · z∈[-66,+58]mm · 10 of 26 slices shown]
[im 1/26]
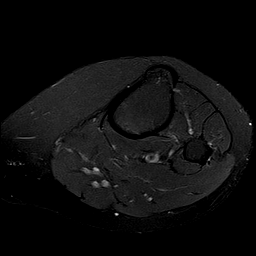
[im 3/26]
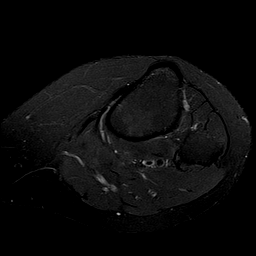
[im 6/26]
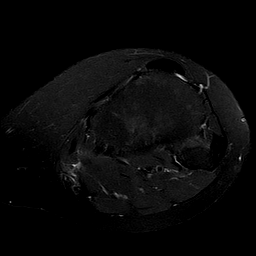
[im 9/26]
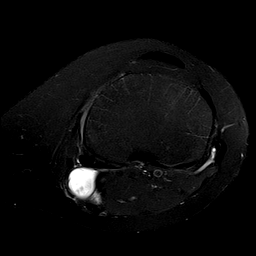
[im 12/26]
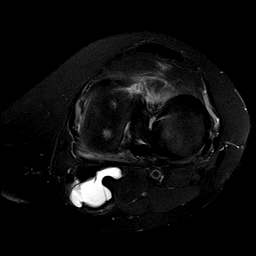
[im 14/26]
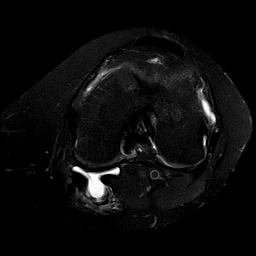
[im 17/26]
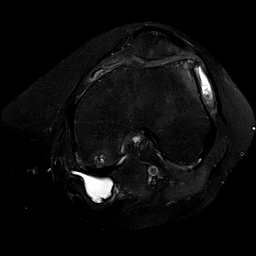
[im 20/26]
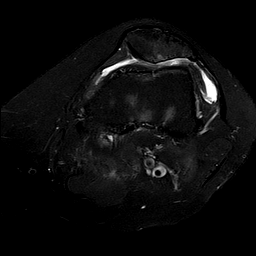
[im 23/26]
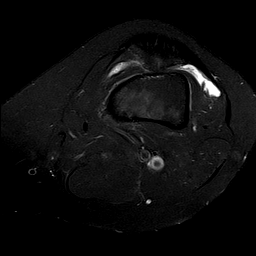
[im 26/26]
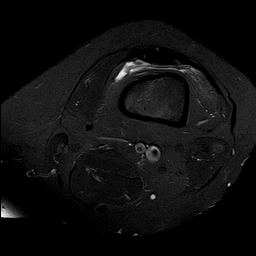

[Series 4: pd_sag_fs · sagittal · 3.0mm · 0.57mm/px · 8 of 25 slices shown]
[im 1/25]
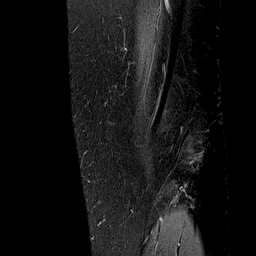
[im 4/25]
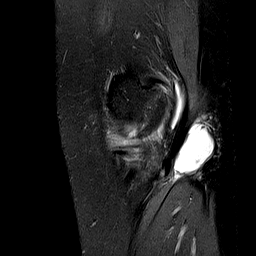
[im 7/25]
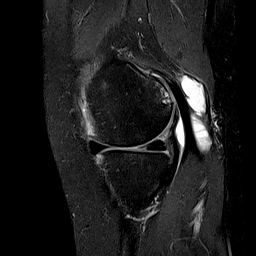
[im 11/25]
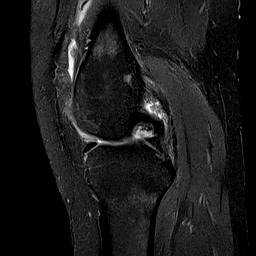
[im 14/25]
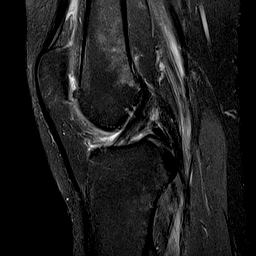
[im 18/25]
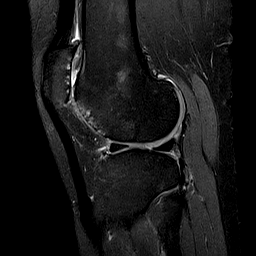
[im 21/25]
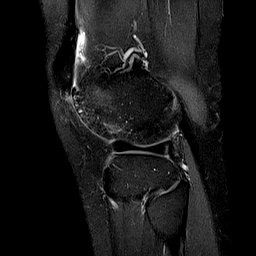
[im 25/25]
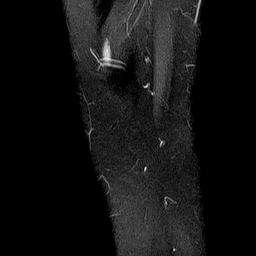

[Series 5: t2_sag_fs · sagittal · 3.0mm · 0.57mm/px · 8 of 25 slices shown]
[im 1/25]
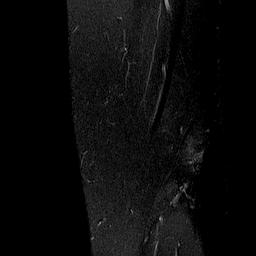
[im 4/25]
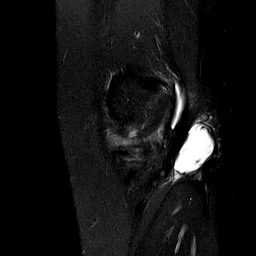
[im 7/25]
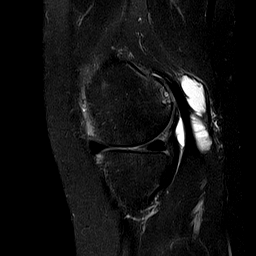
[im 11/25]
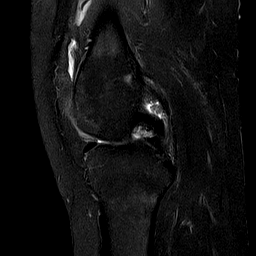
[im 14/25]
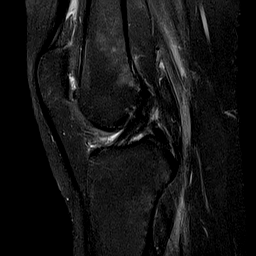
[im 18/25]
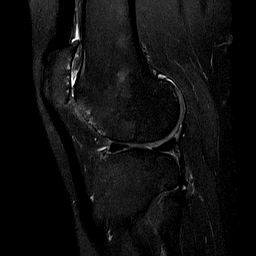
[im 21/25]
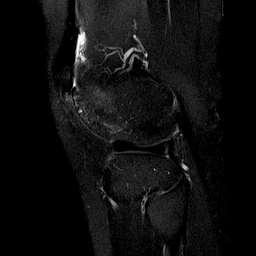
[im 25/25]
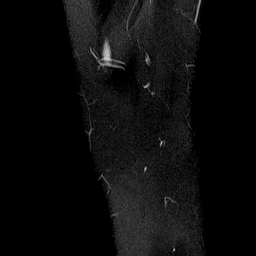

[Series 6: t1_cor · coronal · 4.0mm · 0.44mm/px · 7 of 22 slices shown]
[im 1/22]
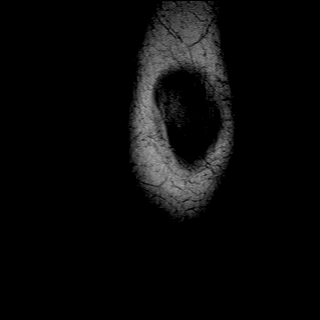
[im 4/22]
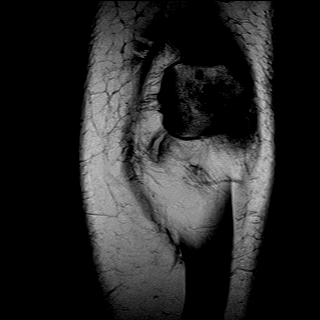
[im 8/22]
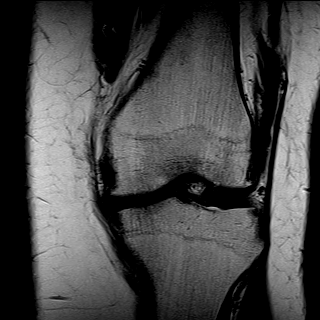
[im 11/22]
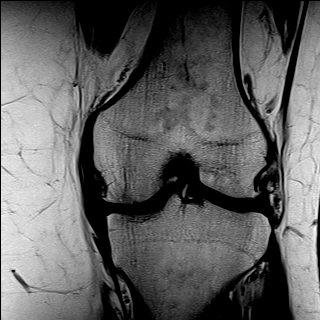
[im 15/22]
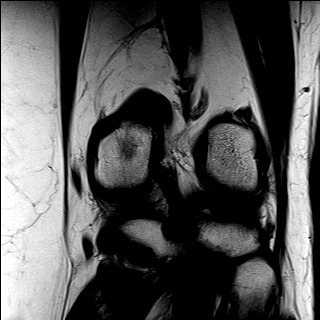
[im 18/22]
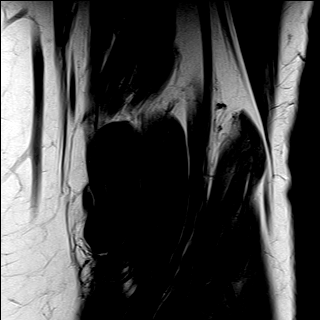
[im 22/22]
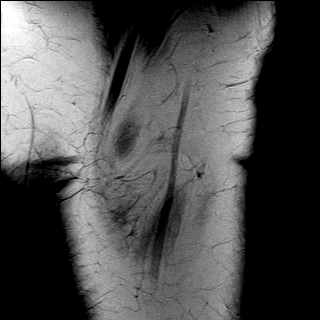

[Series 7: t2_cor_fs · coronal · 4.0mm · 0.44mm/px · 7 of 22 slices shown]
[im 1/22]
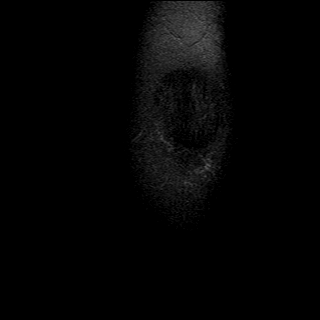
[im 4/22]
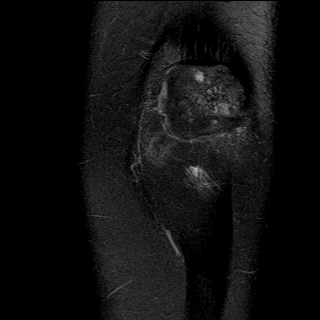
[im 8/22]
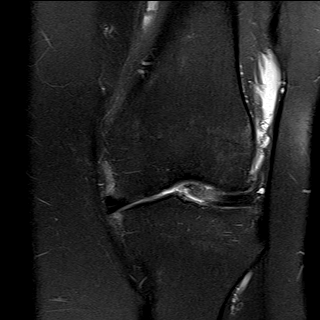
[im 11/22]
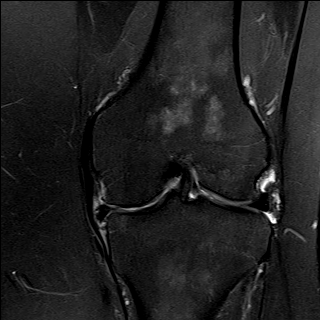
[im 15/22]
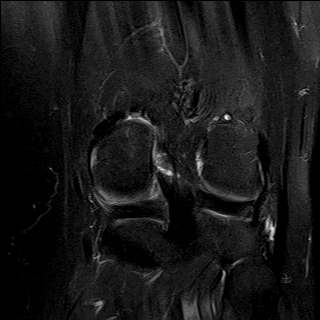
[im 18/22]
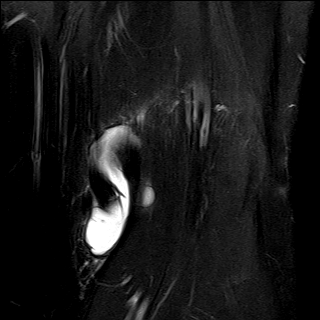
[im 22/22]
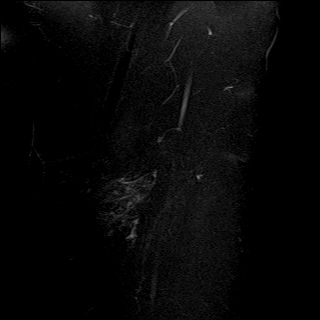

[40 of 40 positions shown; findings below may reference images not displayed]

FINDINGS: MEDIAL MENISCUS:  Complex tear of the body and posterior horn with radial free edge and oblique undersurface components. Complete extrusion of the body into the gutter.

LATERAL MENISCUS:  Subtle fraying at the free edge of the body.

ACL:  Intact.

PCL:  Intact.

MCL:  Intact.

LATERAL LIGAMENTS AND TENDONS:  Intact.

EXTENSOR MECHANISM:  The quadriceps and patellar tendons are intact.

FAT PADS:   Normal.

CARTILAGE:  

Patellofemoral compartment:  Extensive full-thickness chondral loss.

Medial compartment:  Extensive high-grade partial-thickness and full-thickness chondral loss.

Lateral compartment: Low-grade chondral defects throughout the weightbearing femoral condyle. Chondral thinning and surface irregularity, inner aspect of the tibial plateau. Small high-grade chondral defect at the nonweightbearing femoral condyle.

BONE MARROW: No acute fracture. Scattered subchondral marrow edema. Small marginal osteophytes. No erosion.

Moderate-sized Baker's cyst. Small joint effusion with mild debris. No large ossified loose body.
IMPRESSION: 1.
Complex tear of the body and posterior horn medial meniscus, with complete extrusion of the body into the gutter.

2.
Severe patellofemoral and medial compartment chondral abnormalities.

3.
Moderate-severe lateral compartment chondral abnormalities.

## 2022-02-02 IMAGING — MR MRI KNEE RT WO CONTRAST
5 series · 40 of 40 positions shown · non-contrast
Comparison: None.

Images Obtained from [facility]
MRI KNEE RT WO CONTRAST
INDICATION: Pain in right knee. Other chronic pain
TECHNIQUE: Multiplanar, multiecho imaging of the right knee was performed, including T1-weighted and fluid sensitive sequences without intravenous contrast administration.

[Series 2: t2_axial_fs · axial · 4.0mm · 0.57mm/px · z∈[-77,+48]mm · 10 of 26 slices shown]
[im 1/26]
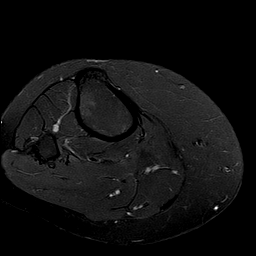
[im 3/26]
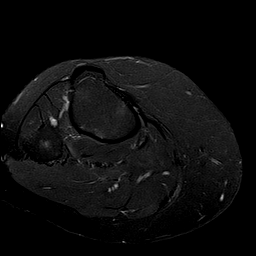
[im 6/26]
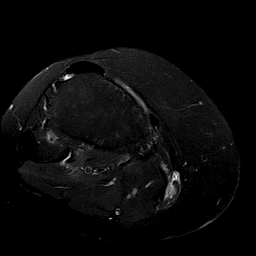
[im 9/26]
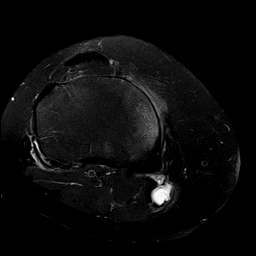
[im 12/26]
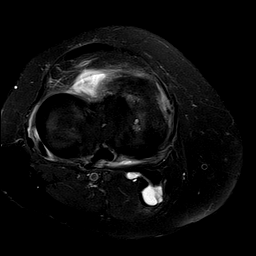
[im 14/26]
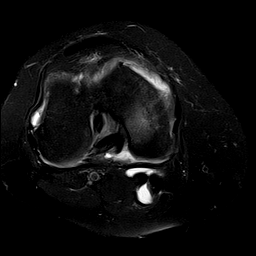
[im 17/26]
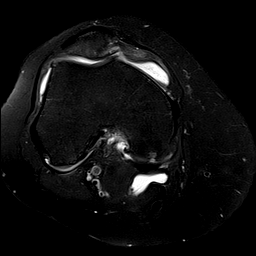
[im 20/26]
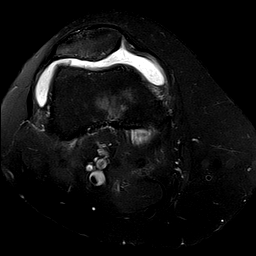
[im 23/26]
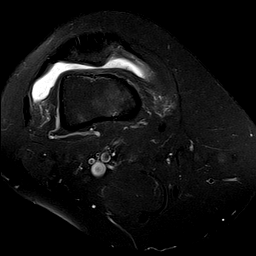
[im 26/26]
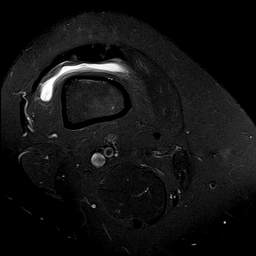

[Series 4: pd_sag_fs · sagittal · 3.0mm · 0.57mm/px · 8 of 25 slices shown]
[im 1/25]
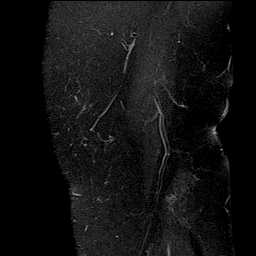
[im 4/25]
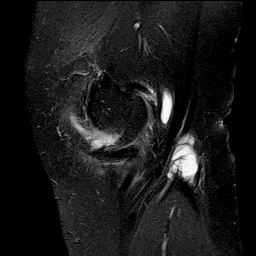
[im 7/25]
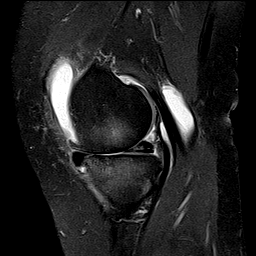
[im 11/25]
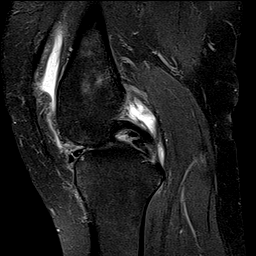
[im 14/25]
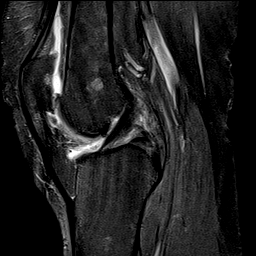
[im 18/25]
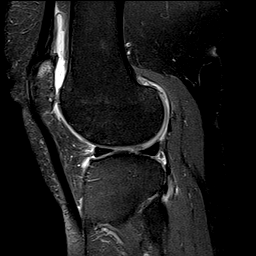
[im 21/25]
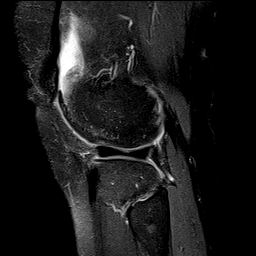
[im 25/25]
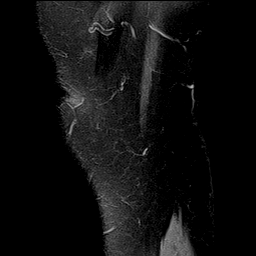

[Series 5: t2_sag_fs · sagittal · 3.0mm · 0.57mm/px · 8 of 25 slices shown]
[im 1/25]
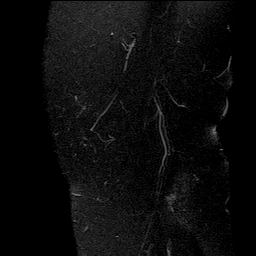
[im 4/25]
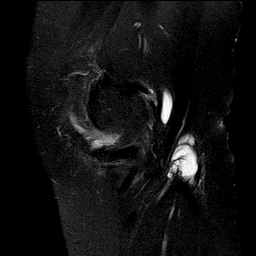
[im 7/25]
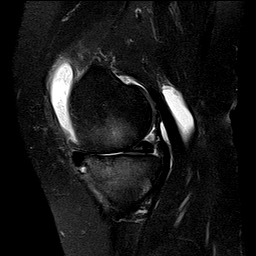
[im 11/25]
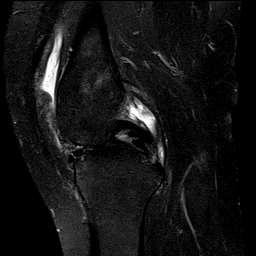
[im 14/25]
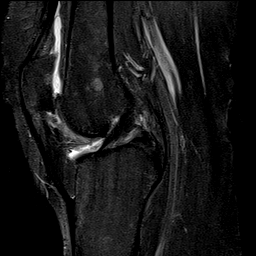
[im 18/25]
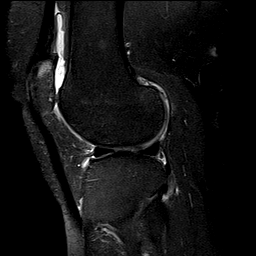
[im 21/25]
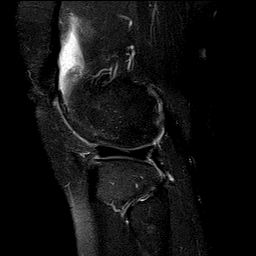
[im 25/25]
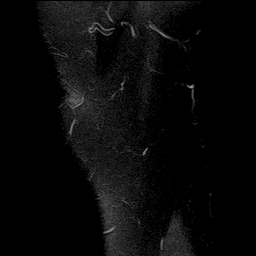

[Series 6: t1_cor · coronal · 4.0mm · 0.44mm/px · 7 of 22 slices shown]
[im 1/22]
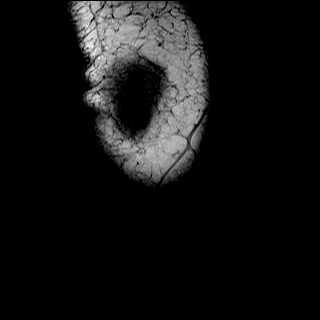
[im 4/22]
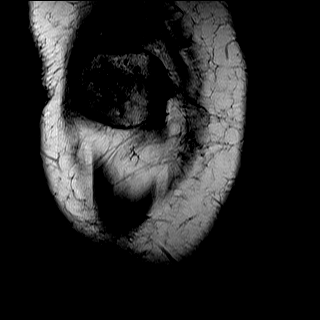
[im 8/22]
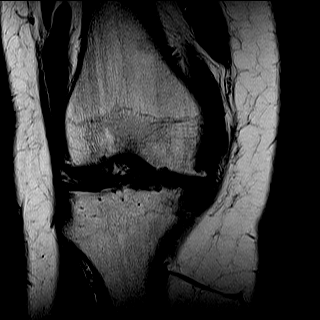
[im 11/22]
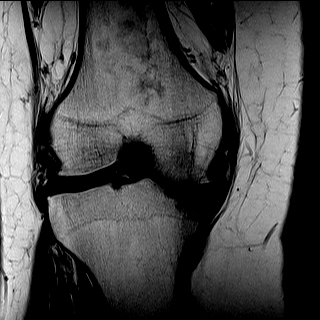
[im 15/22]
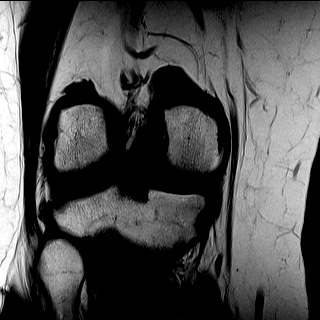
[im 18/22]
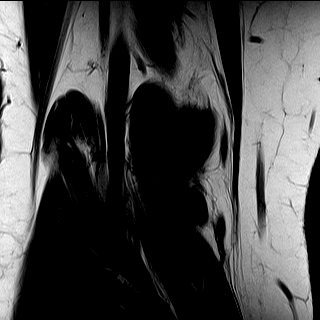
[im 22/22]
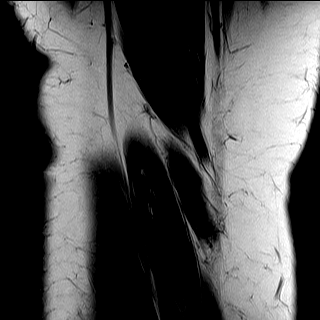

[Series 7: t2_cor_fs · coronal · 4.0mm · 0.44mm/px · 7 of 22 slices shown]
[im 1/22]
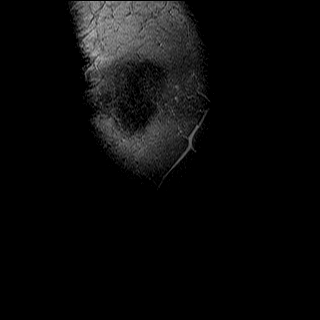
[im 4/22]
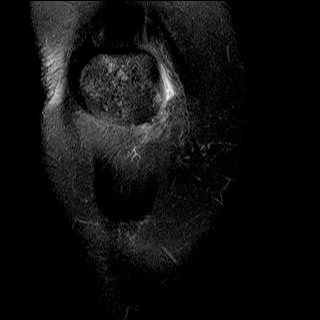
[im 8/22]
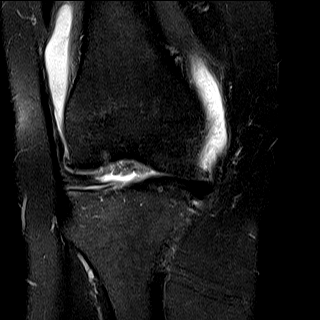
[im 11/22]
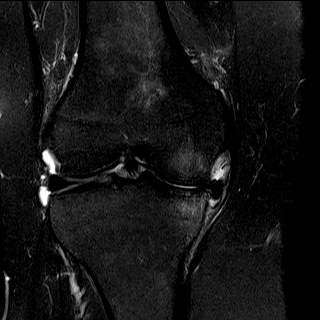
[im 15/22]
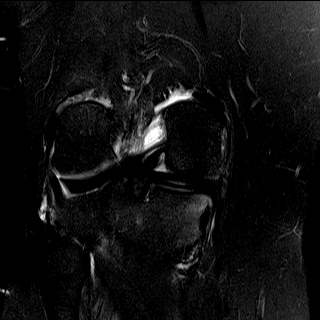
[im 18/22]
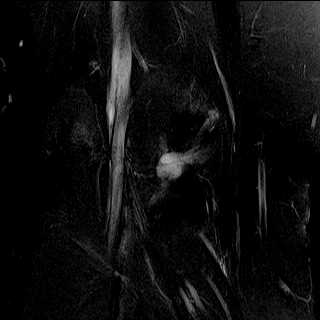
[im 22/22]
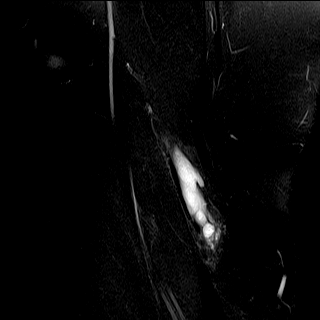

[40 of 40 positions shown; findings below may reference images not displayed]

FINDINGS: MEDIAL MENISCUS:  Degenerative maceration of the body which is completely extruded. Small metatarsus primus fraying at the free edge of the anterior horn which is extruded. Complex tear of the
posterior horn with small radial free edge and horizontal superior surface components.
LATERAL MENISCUS:  Small radial tear/fraying at the free edge of the body which is partially extruded. Subtle fraying of the free edge of the posterior horn.
ACL:  Intact.
PCL:  Intact.
MCL:  Intact.
LATERAL LIGAMENTS AND TENDONS:  Intact.
EXTENSOR MECHANISM:  The quadriceps and patellar tendons are intact.
FAT PADS:   Normal.
CARTILAGE:
Patellofemoral compartment:  Extensive full-thickness chondral loss.
Medial compartment:  Extensive full-thickness chondral loss.
Lateral compartment: Scattered chondral surface irregularity. Small high-grade chondral defects at the inner aspect of the tibial plateau. Chondral surface irregularity and thinning of the
nonweightbearing femoral condyle.
BONE MARROW: Marked subchondral marrow edema at the patellofemoral and medial compartments. No fracture. No erosion. Small marginal osteophytes.
Large joint effusion with mild synovitis/debris. Moderate size Baker's cyst.
IMPRESSION: 1.  Degenerative maceration of the medial meniscus body, which is completely extruded. Complex tear of the posterior horn medial meniscus.
2.  Small radial tear/fraying at the free edge of the lateral meniscus body, which is partially extruded.
3.  Severe patellofemoral and medial compartment chondral abnormalities.
4.  Mild-moderate lateral compartment chondral abnormalities.
5.  Large joint effusion with mild synovitis/debris.

## 2022-03-15 IMAGING — MG MAMMO SCRN BIL W/CAD TOMO
8 series · 8 of 24 positions shown · non-contrast
Comparison: The present examination has been compared to prior imaging studies.

Images Obtained from [facility]
INDICATION: Screening.
TECHNIQUE: Bilateral 2-D digital screening mammogram was performed followed by 3-D tomosynthesis.  Current study was also evaluated with a computer aided detection (CAD) system.
MAMMOGRAM FINDINGS:
There are scattered areas of fibroglandular density.
No suspicious abnormality is seen in either breast.  There are no significant changes from the prior study.

[L MLO]
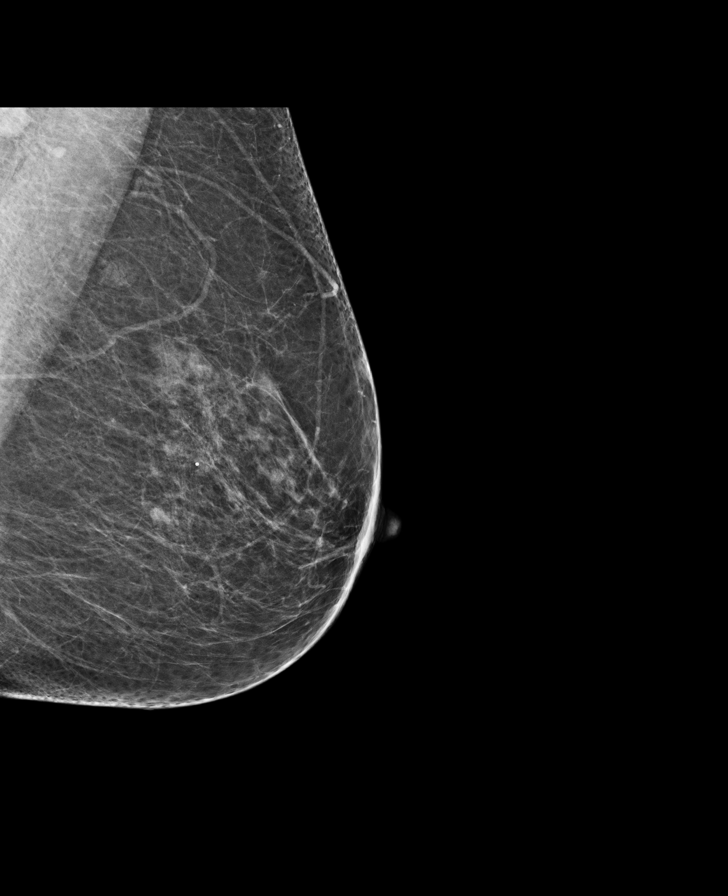

[L CC]
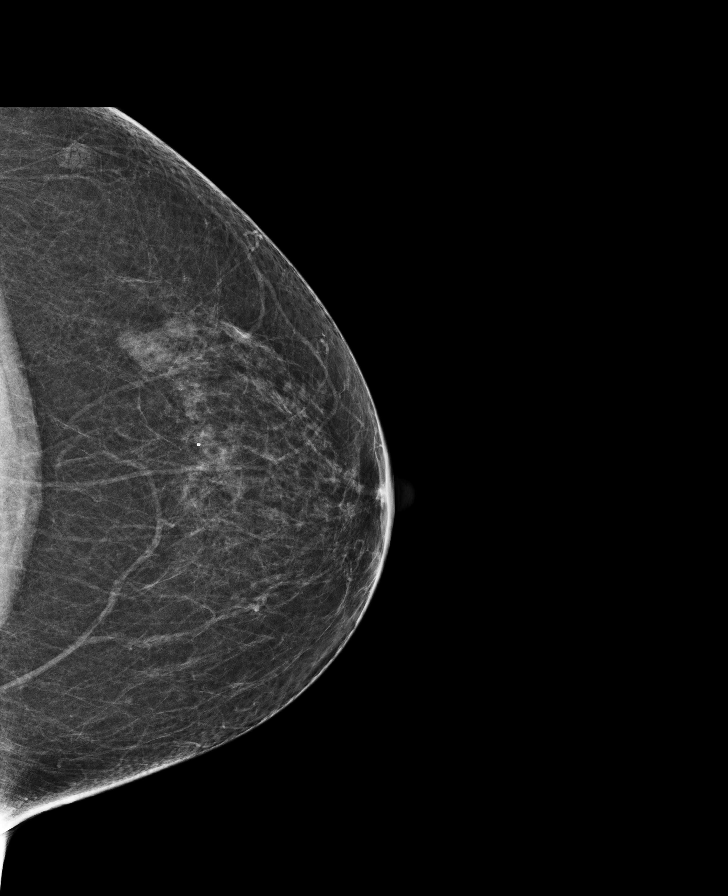

[R CC]
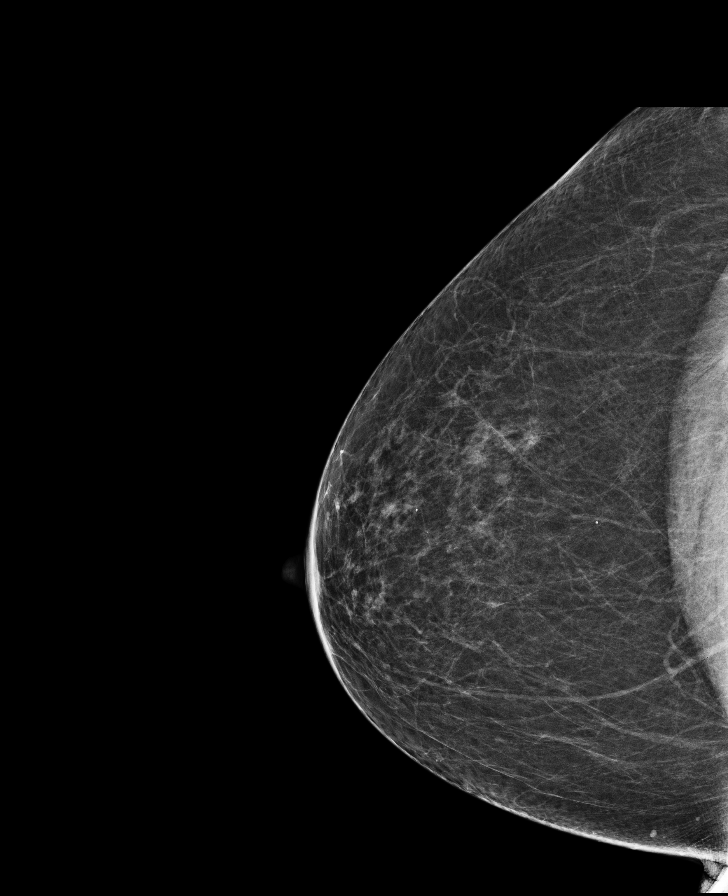

[R MLO]
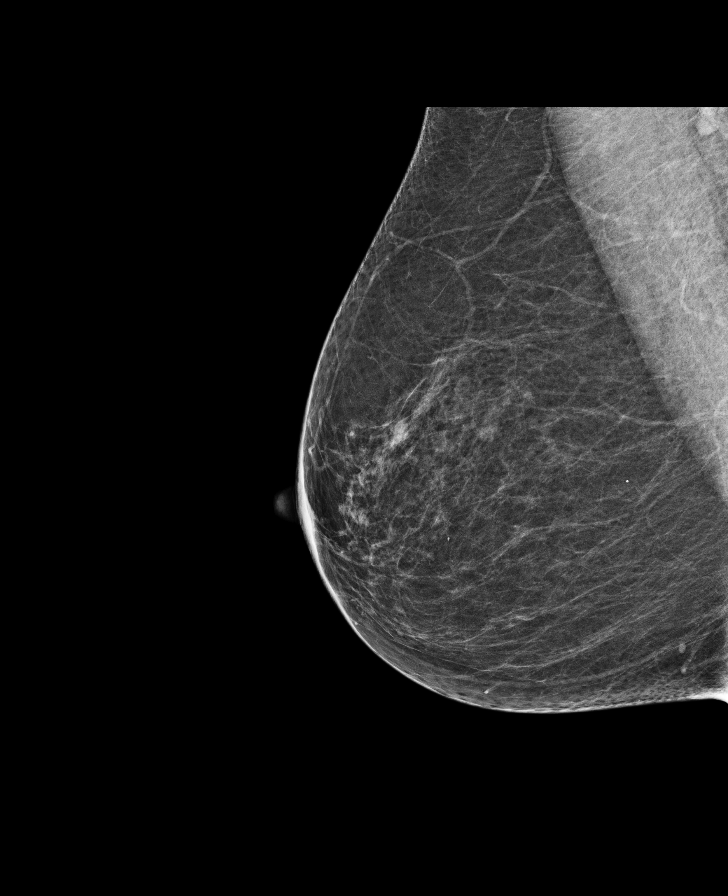

[L MLO tomo · tomo slice 34/67.0]
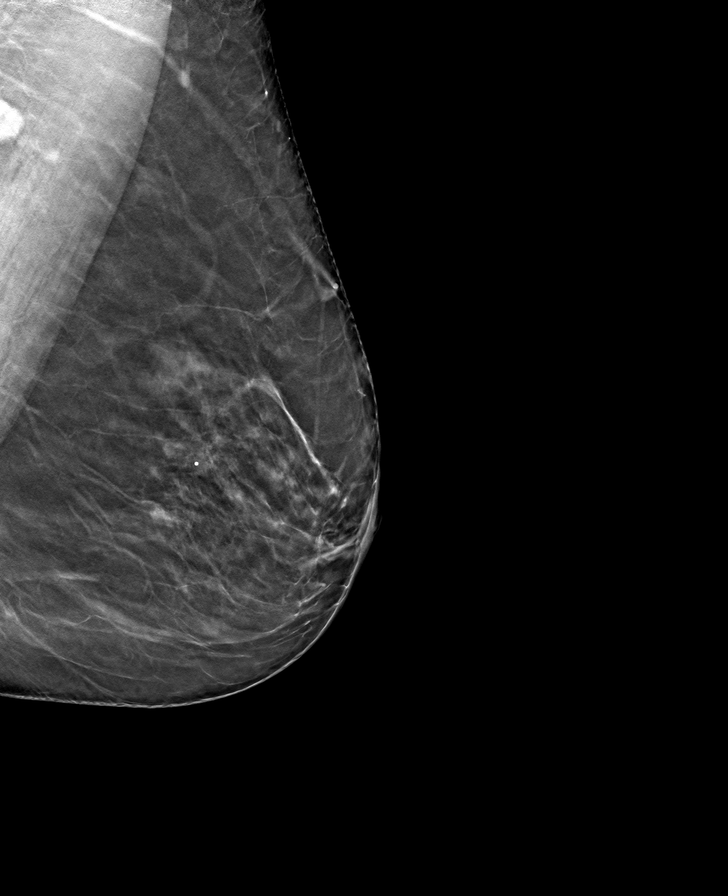

[R CC tomo · tomo slice 35/68.0]
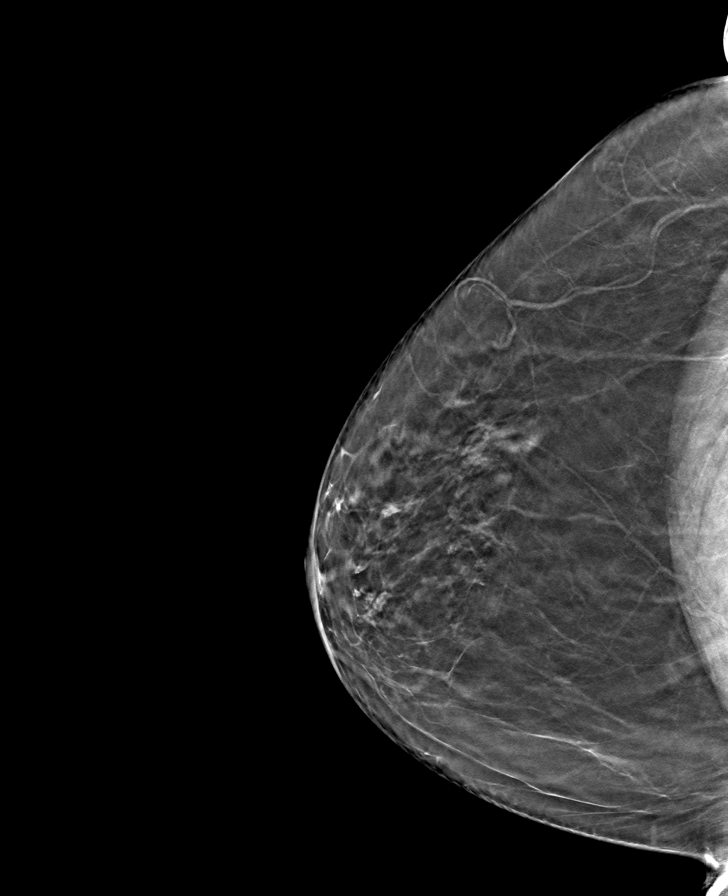

[L CC tomo · tomo slice 34/67.0]
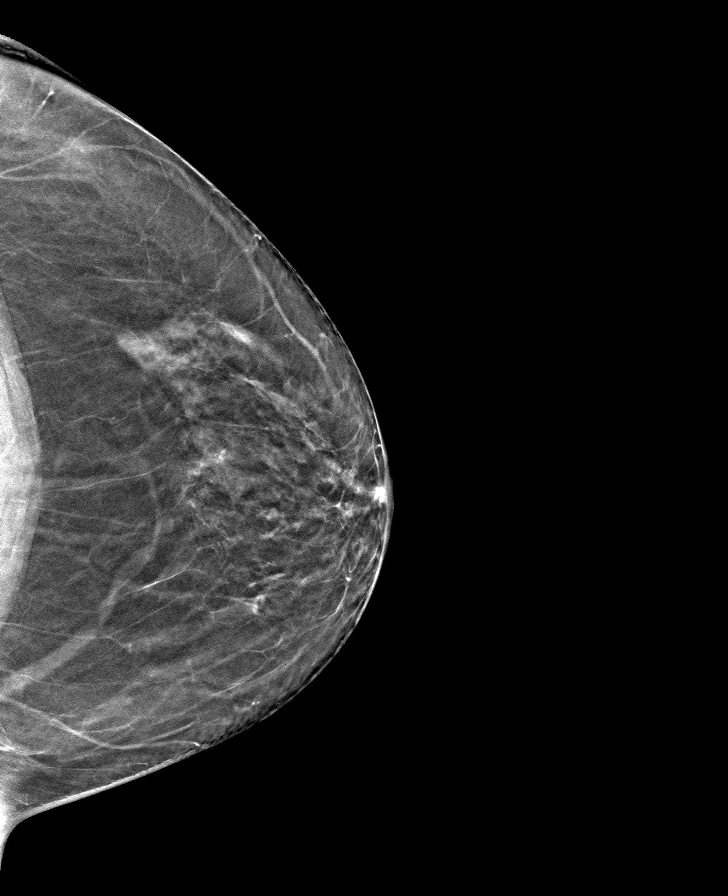

[R MLO tomo · tomo slice 34/67.0]
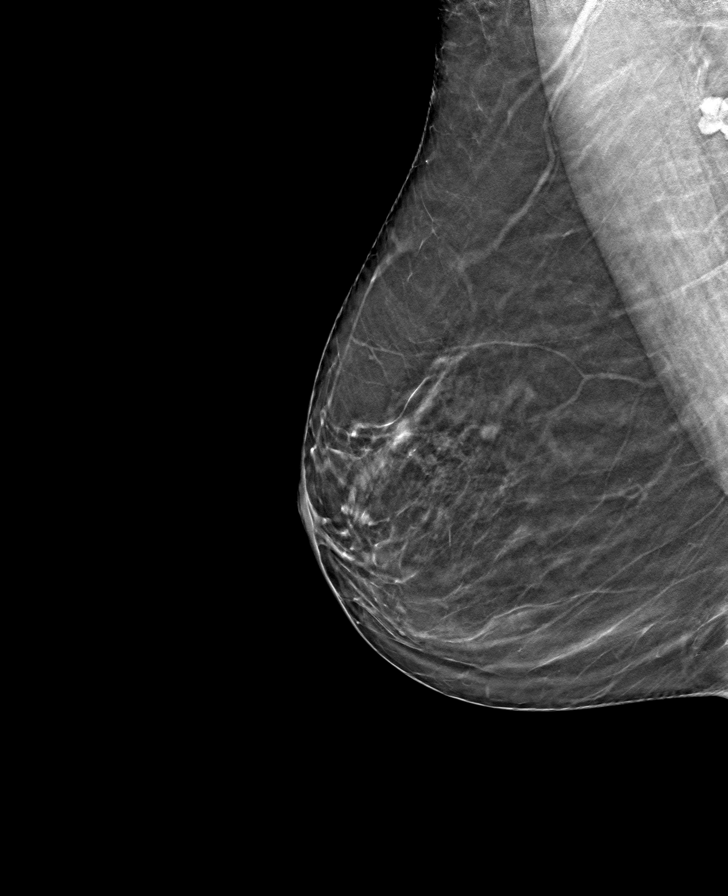

[8 of 24 positions shown; findings below may reference images not displayed]

IMPRESSION: There is no mammographic evidence of malignancy.
Screening mammogram recommended in 1 year.
BI-RADS Category 1: Negative
